# Patient Record
Sex: Female | Born: 1940 | Race: White | Hispanic: No | Marital: Married | Smoking: Former smoker
Health system: Southern US, Community
[De-identification: ages and names within clinical notes are randomized; demographics above are authoritative.]

## PROBLEM LIST (undated history)

## (undated) DIAGNOSIS — M549 Dorsalgia, unspecified: Secondary | ICD-10-CM

## (undated) DIAGNOSIS — Z8679 Personal history of other diseases of the circulatory system: Secondary | ICD-10-CM

## (undated) DIAGNOSIS — M170 Bilateral primary osteoarthritis of knee: Secondary | ICD-10-CM

## (undated) DIAGNOSIS — G5620 Lesion of ulnar nerve, unspecified upper limb: Secondary | ICD-10-CM

## (undated) DIAGNOSIS — E785 Hyperlipidemia, unspecified: Secondary | ICD-10-CM

## (undated) DIAGNOSIS — M8580 Other specified disorders of bone density and structure, unspecified site: Secondary | ICD-10-CM

## (undated) DIAGNOSIS — H269 Unspecified cataract: Secondary | ICD-10-CM

## (undated) DIAGNOSIS — Z8619 Personal history of other infectious and parasitic diseases: Secondary | ICD-10-CM

## (undated) DIAGNOSIS — C4490 Unspecified malignant neoplasm of skin, unspecified: Secondary | ICD-10-CM

## (undated) DIAGNOSIS — K5790 Diverticulosis of intestine, part unspecified, without perforation or abscess without bleeding: Secondary | ICD-10-CM

## (undated) DIAGNOSIS — G8929 Other chronic pain: Secondary | ICD-10-CM

## (undated) HISTORY — PX: SHOULDER DEBRIDEMENT: SHX1052

## (undated) HISTORY — DX: Bilateral primary osteoarthritis of knee: M17.0

## (undated) HISTORY — DX: Lesion of ulnar nerve, unspecified upper limb: G56.20

## (undated) HISTORY — DX: Unspecified cataract: H26.9

## (undated) HISTORY — PX: TUBAL LIGATION: SHX77

## (undated) HISTORY — DX: Hyperlipidemia, unspecified: E78.5

## (undated) HISTORY — PX: APPENDECTOMY: SHX54

## (undated) HISTORY — PX: CHOLECYSTECTOMY: SHX55

## (undated) HISTORY — DX: Diverticulosis of intestine, part unspecified, without perforation or abscess without bleeding: K57.90

## (undated) HISTORY — PX: BACK SURGERY: SHX140

## (undated) HISTORY — PX: LAPAROSCOPIC HYSTERECTOMY: SHX1926

## (undated) HISTORY — PX: ABDOMINAL HYSTERECTOMY: SHX81

---

## 1979-09-17 HISTORY — PX: CARDIAC VALVE REPLACEMENT: SHX585

## 1979-09-17 HISTORY — PX: OTHER SURGICAL HISTORY: SHX169

## 1998-05-24 ENCOUNTER — Other Ambulatory Visit: Admission: RE | Admit: 1998-05-24 | Discharge: 1998-05-24 | Payer: Self-pay | Admitting: Obstetrics and Gynecology

## 1998-08-28 ENCOUNTER — Inpatient Hospital Stay (HOSPITAL_COMMUNITY): Admission: EM | Admit: 1998-08-28 | Discharge: 1998-08-30 | Payer: Self-pay | Admitting: Emergency Medicine

## 1998-08-30 ENCOUNTER — Encounter: Payer: Self-pay | Admitting: *Deleted

## 1999-08-02 ENCOUNTER — Other Ambulatory Visit: Admission: RE | Admit: 1999-08-02 | Discharge: 1999-08-02 | Payer: Self-pay | Admitting: Obstetrics and Gynecology

## 2000-09-10 ENCOUNTER — Other Ambulatory Visit: Admission: RE | Admit: 2000-09-10 | Discharge: 2000-09-10 | Payer: Self-pay | Admitting: Obstetrics and Gynecology

## 2001-10-26 ENCOUNTER — Other Ambulatory Visit: Admission: RE | Admit: 2001-10-26 | Discharge: 2001-10-26 | Payer: Self-pay | Admitting: Obstetrics and Gynecology

## 2002-04-13 HISTORY — PX: COLONOSCOPY: SHX174

## 2003-03-10 ENCOUNTER — Other Ambulatory Visit: Admission: RE | Admit: 2003-03-10 | Discharge: 2003-03-10 | Payer: Self-pay | Admitting: Obstetrics and Gynecology

## 2004-03-21 ENCOUNTER — Other Ambulatory Visit: Admission: RE | Admit: 2004-03-21 | Discharge: 2004-03-21 | Payer: Self-pay | Admitting: Obstetrics and Gynecology

## 2004-10-23 ENCOUNTER — Ambulatory Visit: Payer: Self-pay | Admitting: Cardiology

## 2004-11-05 ENCOUNTER — Ambulatory Visit: Payer: Self-pay | Admitting: *Deleted

## 2004-11-07 ENCOUNTER — Ambulatory Visit: Payer: Self-pay | Admitting: *Deleted

## 2005-01-21 ENCOUNTER — Ambulatory Visit: Payer: Self-pay | Admitting: *Deleted

## 2005-04-09 ENCOUNTER — Ambulatory Visit: Payer: Self-pay | Admitting: Cardiology

## 2005-07-23 ENCOUNTER — Ambulatory Visit: Payer: Self-pay | Admitting: Cardiology

## 2005-10-21 ENCOUNTER — Other Ambulatory Visit: Admission: RE | Admit: 2005-10-21 | Discharge: 2005-10-21 | Payer: Self-pay | Admitting: Obstetrics and Gynecology

## 2005-11-04 ENCOUNTER — Ambulatory Visit: Payer: Self-pay | Admitting: *Deleted

## 2005-11-12 ENCOUNTER — Ambulatory Visit: Payer: Self-pay | Admitting: *Deleted

## 2005-11-25 ENCOUNTER — Encounter: Payer: Self-pay | Admitting: Internal Medicine

## 2005-11-25 ENCOUNTER — Ambulatory Visit: Payer: Self-pay

## 2006-01-08 ENCOUNTER — Ambulatory Visit: Payer: Self-pay | Admitting: *Deleted

## 2006-02-14 ENCOUNTER — Ambulatory Visit: Payer: Self-pay | Admitting: Cardiology

## 2008-04-20 ENCOUNTER — Telehealth (INDEPENDENT_AMBULATORY_CARE_PROVIDER_SITE_OTHER): Payer: Self-pay | Admitting: *Deleted

## 2012-03-16 ENCOUNTER — Encounter: Payer: Self-pay | Admitting: Internal Medicine

## 2012-12-22 ENCOUNTER — Encounter: Payer: Self-pay | Admitting: Internal Medicine

## 2013-02-01 ENCOUNTER — Ambulatory Visit (INDEPENDENT_AMBULATORY_CARE_PROVIDER_SITE_OTHER): Payer: Medicare PPO | Admitting: Family Medicine

## 2013-02-01 ENCOUNTER — Encounter: Payer: Self-pay | Admitting: Family Medicine

## 2013-02-01 ENCOUNTER — Ambulatory Visit (HOSPITAL_BASED_OUTPATIENT_CLINIC_OR_DEPARTMENT_OTHER)
Admission: RE | Admit: 2013-02-01 | Discharge: 2013-02-01 | Disposition: A | Discharge status: Home / Self Care | Payer: Medicare PPO | Source: Ambulatory Visit | Attending: Family Medicine | Admitting: Family Medicine

## 2013-02-01 VITALS — BP 149/84 | HR 82 | Ht 66.0 in | Wt 145.0 lb

## 2013-02-01 DIAGNOSIS — M546 Pain in thoracic spine: Secondary | ICD-10-CM

## 2013-02-01 DIAGNOSIS — R937 Abnormal findings on diagnostic imaging of other parts of musculoskeletal system: Secondary | ICD-10-CM | POA: Insufficient documentation

## 2013-02-01 DIAGNOSIS — S22000A Wedge compression fracture of unspecified thoracic vertebra, initial encounter for closed fracture: Secondary | ICD-10-CM

## 2013-02-01 DIAGNOSIS — S22009A Unspecified fracture of unspecified thoracic vertebra, initial encounter for closed fracture: Secondary | ICD-10-CM

## 2013-02-01 MED ORDER — TRAMADOL HCL 50 MG PO TABS: 50.0000 mg | ORAL_TABLET | Freq: Four times a day (QID) | ORAL | Status: DC | PRN

## 2013-02-01 MED ORDER — LIDOCAINE 5 % EX PTCH: 1.0000 | MEDICATED_PATCH | CUTANEOUS | Status: DC

## 2013-02-01 MED ORDER — TIZANIDINE HCL 4 MG PO TABS: 4.0000 mg | ORAL_TABLET | Freq: Three times a day (TID) | ORAL | Status: DC

## 2013-02-01 NOTE — Progress Notes (Signed)
  Subjective:    Patient ID: Marissa Brown, female    DOB: Jun 14, 1941, 72 y.o.   MRN: [id number]  Marissa Brown is here today complaining of pain in the middle of her upper back.  She fell on her back about 9 weeks ago.  She went to see a chiropractor (Dr. Cyril Mourning) and was told she has a compression fracture in her thoracic spine.  She feels that this problem has not improved with the chiropractor so she decided to get a second opinion.   Back Pain This is a new problem. The current episode started more than 1 month ago. The problem occurs daily. The problem has been gradually improving since onset. The pain is present in the thoracic spine. The quality of the pain is described as aching and cramping. The pain does not radiate. The pain is at a severity of 5/10. The pain is moderate. The pain is worse during the day. The symptoms are aggravated by position, sitting and standing. Stiffness is present all day. Pertinent negatives include no numbness, paresthesias or weakness. Risk factors include recent trauma. She has tried chiropractic manipulation, NSAIDs, home exercises and heat for the symptoms. The treatment provided mild relief.   Review of Systems  Musculoskeletal: Positive for back pain.  Neurological: Negative for weakness, numbness and paresthesias.   Past Medical History  Diagnosis Date  . Hyperlipidemia   . Hypertension   . Osteoarthritis of both knees     and hands  . Cubital tunnel syndrome   . Diverticulosis   . Cataract     Right Eye   Family History  Problem Relation Age of Onset  . Heart disease Father   . Heart disease Paternal Uncle    History   Social History Narrative   Marital Status: Married Radiation protection practitioner)   Children:  Son (1) Daughter (1)     Pets: None   Living Situation: Lives with husband   Occupation:  Retired Human resources officer)   Education: Engineer, maintenance (IT)    Tobacco Use/Exposure:  Quit 15 yrs ago   Alcohol Use:  Occasional   Drug Use:  None   Diet:  Regular   Exercise:  None   Hobbies: Yard Work/ Manufacturing engineer             Objective:   Physical Exam  Constitutional: No distress.  Neck: Neck supple.  Cardiovascular: Normal rate, regular rhythm and normal heart sounds.   Pulmonary/Chest: Effort normal and breath sounds normal. No respiratory distress. She exhibits no tenderness.  Musculoskeletal: She exhibits tenderness (Pain in mid thoracic spine ).          Assessment & Plan:

## 2013-02-13 DIAGNOSIS — S22000A Wedge compression fracture of unspecified thoracic vertebra, initial encounter for closed fracture: Secondary | ICD-10-CM | POA: Insufficient documentation

## 2013-02-13 DIAGNOSIS — M546 Pain in thoracic spine: Secondary | ICD-10-CM | POA: Insufficient documentation

## 2013-02-13 NOTE — Assessment & Plan Note (Addendum)
Marissa Brown is to get a TLSO brace that she will wear until she can see Dr. Gerlene Fee.  She may benefit from kyphoplasty. She was given medications to help her pain.

## 2013-02-17 ENCOUNTER — Other Ambulatory Visit: Payer: Self-pay | Admitting: Neurosurgery

## 2013-02-17 DIAGNOSIS — IMO0002 Reserved for concepts with insufficient information to code with codable children: Secondary | ICD-10-CM

## 2013-02-20 ENCOUNTER — Ambulatory Visit
Admission: RE | Admit: 2013-02-20 | Discharge: 2013-02-20 | Disposition: A | Discharge status: Home / Self Care | Payer: Medicare PPO | Source: Ambulatory Visit | Attending: Neurosurgery | Admitting: Neurosurgery

## 2013-02-20 DIAGNOSIS — IMO0002 Reserved for concepts with insufficient information to code with codable children: Secondary | ICD-10-CM

## 2013-03-11 ENCOUNTER — Encounter (HOSPITAL_COMMUNITY): Payer: Self-pay | Admitting: Pharmacy Technician

## 2013-03-15 ENCOUNTER — Inpatient Hospital Stay (HOSPITAL_COMMUNITY)
Admission: RE | Admit: 2013-03-15 | Discharge: 2013-03-19 | DRG: 517 | Disposition: A | Discharge status: Home / Self Care | Payer: Medicare PPO | Source: Ambulatory Visit | Attending: Neurosurgery | Admitting: Neurosurgery

## 2013-03-15 ENCOUNTER — Encounter (HOSPITAL_COMMUNITY): Payer: Self-pay

## 2013-03-15 DIAGNOSIS — X58XXXA Exposure to other specified factors, initial encounter: Secondary | ICD-10-CM | POA: Diagnosis present

## 2013-03-15 DIAGNOSIS — M19049 Primary osteoarthritis, unspecified hand: Secondary | ICD-10-CM | POA: Diagnosis present

## 2013-03-15 DIAGNOSIS — Z7901 Long term (current) use of anticoagulants: Secondary | ICD-10-CM

## 2013-03-15 DIAGNOSIS — Z9851 Tubal ligation status: Secondary | ICD-10-CM

## 2013-03-15 DIAGNOSIS — Z79899 Other long term (current) drug therapy: Secondary | ICD-10-CM

## 2013-03-15 DIAGNOSIS — K5730 Diverticulosis of large intestine without perforation or abscess without bleeding: Secondary | ICD-10-CM | POA: Diagnosis present

## 2013-03-15 DIAGNOSIS — Z87891 Personal history of nicotine dependence: Secondary | ICD-10-CM

## 2013-03-15 DIAGNOSIS — E785 Hyperlipidemia, unspecified: Secondary | ICD-10-CM | POA: Diagnosis present

## 2013-03-15 DIAGNOSIS — Z9089 Acquired absence of other organs: Secondary | ICD-10-CM

## 2013-03-15 DIAGNOSIS — IMO0002 Reserved for concepts with insufficient information to code with codable children: Principal | ICD-10-CM | POA: Diagnosis present

## 2013-03-15 DIAGNOSIS — Z8249 Family history of ischemic heart disease and other diseases of the circulatory system: Secondary | ICD-10-CM

## 2013-03-15 DIAGNOSIS — Z954 Presence of other heart-valve replacement: Secondary | ICD-10-CM

## 2013-03-15 DIAGNOSIS — I10 Essential (primary) hypertension: Secondary | ICD-10-CM | POA: Diagnosis present

## 2013-03-15 DIAGNOSIS — M1710 Unilateral primary osteoarthritis, unspecified knee: Secondary | ICD-10-CM | POA: Diagnosis present

## 2013-03-15 MED ORDER — SIMVASTATIN 40 MG PO TABS
40.0000 mg | ORAL_TABLET | Freq: Every evening | ORAL | Status: DC
  Administered 2013-03-15 – 2013-03-18 (×4): 40 mg via ORAL
  Filled 2013-03-15 (×5): qty 1

## 2013-03-15 MED ORDER — HEPARIN SODIUM (PORCINE) 5000 UNIT/ML IJ SOLN
5000.0000 [IU] | Freq: Three times a day (TID) | INTRAMUSCULAR | Status: DC
  Administered 2013-03-15 – 2013-03-16 (×3): 5000 [IU] via SUBCUTANEOUS
  Filled 2013-03-15 (×6): qty 1

## 2013-03-15 MED ORDER — HYDROCODONE-ACETAMINOPHEN 5-325 MG PO TABS: 1.0000 | ORAL_TABLET | ORAL | Status: DC | PRN

## 2013-03-15 MED ORDER — DIPHENHYDRAMINE HCL 25 MG PO CAPS
25.0000 mg | ORAL_CAPSULE | Freq: Four times a day (QID) | ORAL | Status: DC | PRN
  Administered 2013-03-15 – 2013-03-17 (×3): 25 mg via ORAL
  Administered 2013-03-18: 50 mg via ORAL
  Filled 2013-03-15 (×3): qty 1
  Filled 2013-03-15: qty 2

## 2013-03-15 MED ORDER — HYDROCHLOROTHIAZIDE 12.5 MG PO CAPS
12.5000 mg | ORAL_CAPSULE | Freq: Every day | ORAL | Status: DC
  Administered 2013-03-15 – 2013-03-18 (×4): 12.5 mg via ORAL
  Filled 2013-03-15 (×5): qty 1

## 2013-03-15 MED ORDER — PNEUMOCOCCAL VAC POLYVALENT 25 MCG/0.5ML IJ INJ
0.5000 mL | INJECTION | INTRAMUSCULAR | Status: AC
  Administered 2013-03-16: 0.5 mL via INTRAMUSCULAR
  Filled 2013-03-15: qty 0.5

## 2013-03-15 MED ORDER — CELECOXIB 200 MG PO CAPS
200.0000 mg | ORAL_CAPSULE | Freq: Every day | ORAL | Status: DC
  Administered 2013-03-15 – 2013-03-18 (×4): 200 mg via ORAL
  Filled 2013-03-15 (×6): qty 1

## 2013-03-15 MED ORDER — LISINOPRIL 10 MG PO TABS
10.0000 mg | ORAL_TABLET | Freq: Every day | ORAL | Status: DC
  Administered 2013-03-15 – 2013-03-18 (×4): 10 mg via ORAL
  Filled 2013-03-15 (×5): qty 1

## 2013-03-15 MED ORDER — LISINOPRIL-HYDROCHLOROTHIAZIDE 20-25 MG PO TABS: 0.5000 | ORAL_TABLET | Freq: Every day | ORAL | Status: DC

## 2013-03-15 NOTE — Plan of Care (Signed)
Problem: Phase I Progression Outcomes Goal: OOB as tolerated unless otherwise ordered Outcome: Progressing Patient instructed to call for assistance when up but prefers to get up to the bathroom per self.

## 2013-03-15 NOTE — Progress Notes (Signed)
Pharmacy note: heparin Indication: VTE prophylaxis  72 yo female to begin heparin for VTE prophylaxis. Pharmacy has been asked to provide dosing.  Plan -Will give heparin 5000 units sq q8h -Will sign off for now. Please cotnact pharmacy for any questions or concerns  Thank you, Harland German, Pharm D 03/15/2013 11:56 AM

## 2013-03-15 NOTE — Plan of Care (Signed)
Problem: Phase I Progression Outcomes Goal: OOB as tolerated unless otherwise ordered Outcome: Progressing Patient intent to return home upon completion of surgery. Goal: Voiding-avoid urinary catheter unless indicated Outcome: Completed/Met Date Met:  03/15/13 Patient able to fully void without difficulty.

## 2013-03-16 ENCOUNTER — Other Ambulatory Visit: Payer: Self-pay | Admitting: Neurosurgery

## 2013-03-16 LAB — CBC
MCV: 95.6 fL (ref 78.0–100.0)
Platelets: 177 10*3/uL (ref 150–400)
RBC: 3.87 MIL/uL (ref 3.87–5.11)
RDW: 12.7 % (ref 11.5–15.5)
WBC: 4.9 10*3/uL (ref 4.0–10.5)

## 2013-03-16 LAB — PROTIME-INR: Prothrombin Time: 16.5 seconds — ABNORMAL HIGH (ref 11.6–15.2)

## 2013-03-16 LAB — HEPARIN LEVEL (UNFRACTIONATED): Heparin Unfractionated: 0.66 IU/mL (ref 0.30–0.70)

## 2013-03-16 MED ORDER — HEPARIN (PORCINE) IN NACL 100-0.45 UNIT/ML-% IJ SOLN
800.0000 [IU]/h | INTRAMUSCULAR | Status: AC
  Administered 2013-03-16 – 2013-03-17 (×2): 800 [IU]/h via INTRAVENOUS
  Filled 2013-03-16 (×3): qty 250

## 2013-03-16 MED ORDER — HEPARIN BOLUS VIA INFUSION
3000.0000 [IU] | Freq: Once | INTRAVENOUS | Status: AC
  Administered 2013-03-16: 3000 [IU] via INTRAVENOUS
  Filled 2013-03-16: qty 3000

## 2013-03-16 NOTE — H&P (Signed)
  Marissa Brown is an 72 y.o. female.   Chief Complaint: Compression fractures HPI: 72 yo female with severe back pain. Tried on conservative therapy, and no help. Found on imaging to have T7 and T 8 fractures. Now admitted for vertebroplasty. Has artificial heart valve, and has to be anti coagulated. Was on coumadin. Now on heparin until surgery.  Past Medical History  Diagnosis Date  . Hyperlipidemia   . Hypertension   . Osteoarthritis of both knees     and hands  . Cubital tunnel syndrome   . Diverticulosis   . Cataract     Right Eye    Past Surgical History  Procedure Laterality Date  . Cholecystectomy    . Appendectomy    . Tubal ligation    . Shoulder debridement Left     Due to Calcium Deposit  . Cardiac valve replacement  1981    Aortic Valve   . Aortic heart valve  1981    patient must have antibiotics prior to surgical procedures    Family History  Problem Relation Age of Onset  . Heart disease Father   . Heart disease Paternal Uncle    Social History:  reports that she has quit smoking. Her smoking use included Cigarettes. She smoked 0.00 packs per day. She has never used smokeless tobacco. She reports that  drinks alcohol. She reports that she does not use illicit drugs.  Allergies: No Known Allergies  Medications Prior to Admission  Medication Sig Dispense Refill  . calcium citrate-vitamin D (CITRACAL+D) 315-200 MG-UNIT per tablet Take 1 tablet by mouth daily.      . celecoxib (CELEBREX) 200 MG capsule Take 200 mg by mouth daily.       . diphenhydramine-acetaminophen (TYLENOL PM) 25-500 MG TABS Take 1 tablet by mouth at bedtime.      Marland Kitchen lisinopril-hydrochlorothiazide (PRINZIDE,ZESTORETIC) 20-25 MG per tablet Take 0.5 tablets by mouth daily.      . simvastatin (ZOCOR) 40 MG tablet Take 40 mg by mouth every evening.      . warfarin (COUMADIN) 5 MG tablet Take 5-7.5 mg by mouth daily. Take 1 tablet on Tuesday and Thursday.  Take 1.5 tablet (7.5mg ) all other days         No results found for this or any previous visit (from the past 48 hour(s)). No results found.  A comprehensive review of systems was negative.  Blood pressure 110/52, pulse 58, temperature 98 F (36.7 C), temperature source Oral, resp. rate 20, height 5\' 6"  (1.676 m), weight 64.6 kg (142 lb 6.7 oz), SpO2 98.00%.  awake, alert. conversant. Strength intact. gait slow but steady Assessment/Plan Compression fractures x 2. Plan for 2 level vertebroplasty in 2 days.  Reinaldo Meeker, MD 03/16/2013, 1:18 PM

## 2013-03-16 NOTE — Progress Notes (Signed)
ANTICOAGULATION CONSULT NOTE  Pharmacy Consult for heparin Indication: AVR  No Known Allergies  Patient Measurements: Height: 5\' 6"  (167.6 cm) Weight: 142 lb 6.7 oz (64.6 kg) IBW/kg (Calculated) : 59.3 Heparin Dosing Weight: 64.6kg  Vital Signs: Temp: 98.7 F (37.1 C) (07/01 2115) Temp src: Oral (07/01 2115) BP: 123/59 mmHg (07/01 2115) Pulse Rate: 63 (07/01 2115)  Labs:  Recent Labs  03/16/13 1430 03/16/13 2248  HGB 12.9  --   HCT 37.0  --   PLT 177  --   LABPROT 16.5*  --   INR 1.37  --   HEPARINUNFRC  --  0.66    CrCl is unknown because no creatinine reading has been taken.  Assessment: 72 yo female with history of AVR, Coumadin on hold for vertebroplasty for heparin  Goal of Therapy:  Heparin level 0.3-0.7 units/ml Monitor platelets by anticoagulation protocol: Yes   Plan:  Continue Heparin at current rate Follow-up am labs.  Geannie Risen, PharmD, BCPS  03/16/2013 11:24 PM

## 2013-03-16 NOTE — Progress Notes (Signed)
ANTICOAGULATION CONSULT NOTE - Initial Consult  Pharmacy Consult for heparin Indication: AVR  No Known Allergies  Patient Measurements: Height: 5\' 6"  (167.6 cm) Weight: 142 lb 6.7 oz (64.6 kg) IBW/kg (Calculated) : 59.3 Heparin Dosing Weight: 64.6kg  Vital Signs: Temp: 98 F (36.7 C) (07/01 1350) Temp src: Oral (07/01 1350) BP: 113/56 mmHg (07/01 1350) Pulse Rate: 63 (07/01 1350)  Labs:  Recent Labs  03/16/13 1430  HGB 12.9  HCT 37.0  PLT 177  LABPROT 16.5*  INR 1.37    CrCl is unknown because no creatinine reading has been taken.   Medical History: Past Medical History  Diagnosis Date  . Hyperlipidemia   . Hypertension   . Osteoarthritis of both knees     and hands  . Cubital tunnel syndrome   . Diverticulosis   . Cataract     Right Eye    Medications:  Prescriptions prior to admission  Medication Sig Dispense Refill  . calcium citrate-vitamin D (CITRACAL+D) 315-200 MG-UNIT per tablet Take 1 tablet by mouth daily.      . celecoxib (CELEBREX) 200 MG capsule Take 200 mg by mouth daily.       . diphenhydramine-acetaminophen (TYLENOL PM) 25-500 MG TABS Take 1 tablet by mouth at bedtime.      Marland Kitchen lisinopril-hydrochlorothiazide (PRINZIDE,ZESTORETIC) 20-25 MG per tablet Take 0.5 tablets by mouth daily.      . simvastatin (ZOCOR) 40 MG tablet Take 40 mg by mouth every evening.      . warfarin (COUMADIN) 5 MG tablet Take 5-7.5 mg by mouth daily. Take 1 tablet on Tuesday and Thursday.  Take 1.5 tablet (7.5mg ) all other days       Scheduled:  . celecoxib  200 mg Oral Daily  . lisinopril  10 mg Oral Daily   And  . hydrochlorothiazide  12.5 mg Oral Daily  . simvastatin  40 mg Oral QPM    Assessment: 72 yo female with history of AVR here for vertebroplasty. INR= 1.37 and patient to begin heparin as coumadin on hold for surgery. Patient was on sq heparin and last dose was this am at ~ 0600. Hg/Hct= 12.9/37 and plt=177.  Goal of Therapy:  Heparin level 0.3-0.7  units/ml Monitor platelets by anticoagulation protocol: Yes   Plan:  -Heparin 3000 units IV bolus followed by 800 units/hr (~12 units/kg/hr) -Heparin level in 6 hours and daily wth CBC daily  Harland German, Pharm D 03/16/2013 3:22 PM

## 2013-03-16 NOTE — Progress Notes (Signed)
UR COMPLETED  

## 2013-03-17 LAB — CBC
Hemoglobin: 12.9 g/dL (ref 12.0–15.0)
MCH: 33.2 pg (ref 26.0–34.0)
MCHC: 34.6 g/dL (ref 30.0–36.0)
RDW: 12.8 % (ref 11.5–15.5)

## 2013-03-17 LAB — HEPARIN LEVEL (UNFRACTIONATED): Heparin Unfractionated: 0.61 IU/mL (ref 0.30–0.70)

## 2013-03-17 NOTE — Progress Notes (Signed)
Patient ID: Marissa Brown, female   DOB: Jul 21, 1941, 72 y.o.   MRN: [id number] Afeb. vss Ready for surgery tomorrow. Risks benefits discussed. Questions answered. Appears to understand. Will proceed as requested.

## 2013-03-17 NOTE — Progress Notes (Signed)
ANTICOAGULATION CONSULT NOTE  Pharmacy Consult for heparin Indication: AVR  No Known Allergies  Patient Measurements: Height: 5\' 6"  (167.6 cm) Weight: 142 lb 6.7 oz (64.6 kg) IBW/kg (Calculated) : 59.3 Heparin Dosing Weight: 64.6kg  Vital Signs: Temp: 97.4 F (36.3 C) (07/02 0521) Temp src: Oral (07/02 0521) BP: 125/59 mmHg (07/02 0521) Pulse Rate: 58 (07/02 0521)  Labs:  Recent Labs  03/16/13 1430 03/16/13 2248 03/17/13 0525  HGB 12.9  --  12.9  HCT 37.0  --  37.3  PLT 177  --  174  LABPROT 16.5*  --   --   INR 1.37  --   --   HEPARINUNFRC  --  0.66 0.61    CrCl is unknown because no creatinine reading has been taken.  Assessment: 72 yo female with history of AVR, Coumadin on hold for vertebroplasty for heparin  Heparin level therapeutic  Goal of Therapy:  Heparin level 0.3-0.7 units/ml Monitor platelets by anticoagulation protocol: Yes   Plan:  Continue Heparin at current rate Follow-up am labs.  Talbert Cage, PharmD  03/17/2013 1:04 PM

## 2013-03-18 ENCOUNTER — Inpatient Hospital Stay (HOSPITAL_COMMUNITY): Payer: Medicare PPO

## 2013-03-18 ENCOUNTER — Encounter (HOSPITAL_COMMUNITY): Payer: Self-pay | Admitting: Certified Registered Nurse Anesthetist

## 2013-03-18 ENCOUNTER — Encounter (HOSPITAL_COMMUNITY)
Admission: RE | Disposition: A | Discharge status: Home / Self Care | Payer: Self-pay | Source: Ambulatory Visit | Attending: Neurosurgery

## 2013-03-18 ENCOUNTER — Inpatient Hospital Stay (HOSPITAL_COMMUNITY): Payer: Medicare PPO | Admitting: Certified Registered Nurse Anesthetist

## 2013-03-18 HISTORY — PX: VERTEBROPLASTY: SHX113

## 2013-03-18 LAB — CBC
MCH: 32.7 pg (ref 26.0–34.0)
MCV: 96.2 fL (ref 78.0–100.0)
Platelets: 175 10*3/uL (ref 150–400)
RDW: 12.7 % (ref 11.5–15.5)
WBC: 6.4 10*3/uL (ref 4.0–10.5)

## 2013-03-18 SURGERY — VERTEBROPLASTY
Anesthesia: General | Site: Spine Thoracic | Wound class: Clean

## 2013-03-18 MED ORDER — PHENOL 1.4 % MT LIQD: 1.0000 | OROMUCOSAL | Status: DC | PRN

## 2013-03-18 MED ORDER — MEPERIDINE HCL 25 MG/ML IJ SOLN: 6.2500 mg | INTRAMUSCULAR | Status: DC | PRN

## 2013-03-18 MED ORDER — ONDANSETRON HCL 4 MG/2ML IJ SOLN
INTRAMUSCULAR | Status: DC | PRN
  Administered 2013-03-18: 4 mg via INTRAVENOUS

## 2013-03-18 MED ORDER — GLYCOPYRROLATE 0.2 MG/ML IJ SOLN
INTRAMUSCULAR | Status: DC | PRN
  Administered 2013-03-18: 0.4 mg via INTRAVENOUS

## 2013-03-18 MED ORDER — ACETAMINOPHEN 325 MG PO TABS: 650.0000 mg | ORAL_TABLET | ORAL | Status: DC | PRN

## 2013-03-18 MED ORDER — PROMETHAZINE HCL 25 MG/ML IJ SOLN
6.2500 mg | INTRAMUSCULAR | Status: DC | PRN
  Filled 2013-03-18: qty 1

## 2013-03-18 MED ORDER — LACTATED RINGERS IV SOLN
INTRAVENOUS | Status: DC | PRN
  Administered 2013-03-18 (×2): via INTRAVENOUS

## 2013-03-18 MED ORDER — WARFARIN SODIUM 5 MG PO TABS: 5.0000 mg | ORAL_TABLET | Freq: Every day | ORAL | Status: DC

## 2013-03-18 MED ORDER — NEOSTIGMINE METHYLSULFATE 1 MG/ML IJ SOLN
INTRAMUSCULAR | Status: DC | PRN
  Administered 2013-03-18: 3 mg via INTRAVENOUS

## 2013-03-18 MED ORDER — MENTHOL 3 MG MT LOZG
1.0000 | LOZENGE | OROMUCOSAL | Status: DC | PRN
  Filled 2013-03-18: qty 9

## 2013-03-18 MED ORDER — CEFAZOLIN SODIUM 1-5 GM-% IV SOLN
1.0000 g | Freq: Three times a day (TID) | INTRAVENOUS | Status: AC
  Administered 2013-03-18 – 2013-03-19 (×2): 1 g via INTRAVENOUS
  Filled 2013-03-18 (×2): qty 50

## 2013-03-18 MED ORDER — BUPIVACAINE HCL 0.5 % IJ SOLN
INTRAMUSCULAR | Status: DC | PRN
  Administered 2013-03-18: 9 mL
  Administered 2013-03-18: 7 mL

## 2013-03-18 MED ORDER — 0.9 % SODIUM CHLORIDE (POUR BTL) OPTIME
TOPICAL | Status: DC | PRN
  Administered 2013-03-18: 1000 mL

## 2013-03-18 MED ORDER — HYDROMORPHONE HCL PF 1 MG/ML IJ SOLN: 0.2500 mg | INTRAMUSCULAR | Status: DC | PRN

## 2013-03-18 MED ORDER — KCL IN DEXTROSE-NACL 20-5-0.45 MEQ/L-%-% IV SOLN
80.0000 mL/h | INTRAVENOUS | Status: DC
  Administered 2013-03-18: 80 mL/h via INTRAVENOUS
  Filled 2013-03-18 (×3): qty 1000

## 2013-03-18 MED ORDER — MIDAZOLAM HCL 2 MG/2ML IJ SOLN: 0.5000 mg | Freq: Once | INTRAMUSCULAR | Status: DC | PRN

## 2013-03-18 MED ORDER — WARFARIN SODIUM 7.5 MG PO TABS
7.5000 mg | ORAL_TABLET | ORAL | Status: DC
  Filled 2013-03-18: qty 1

## 2013-03-18 MED ORDER — SODIUM CHLORIDE 0.9 % IJ SOLN: 3.0000 mL | INTRAMUSCULAR | Status: DC | PRN

## 2013-03-18 MED ORDER — CEFAZOLIN SODIUM-DEXTROSE 2-3 GM-% IV SOLR
INTRAVENOUS | Status: DC | PRN
  Administered 2013-03-18: 2 g via INTRAVENOUS

## 2013-03-18 MED ORDER — EPHEDRINE SULFATE 50 MG/ML IJ SOLN
INTRAMUSCULAR | Status: DC | PRN
  Administered 2013-03-18 (×3): 5 mg via INTRAVENOUS

## 2013-03-18 MED ORDER — HYDROMORPHONE HCL PF 1 MG/ML IJ SOLN: 1.0000 mg | INTRAMUSCULAR | Status: DC | PRN

## 2013-03-18 MED ORDER — LIDOCAINE HCL (CARDIAC) 20 MG/ML IV SOLN
INTRAVENOUS | Status: DC | PRN
  Administered 2013-03-18: 30 mg via INTRAVENOUS

## 2013-03-18 MED ORDER — OXYCODONE HCL 5 MG PO TABS: 5.0000 mg | ORAL_TABLET | Freq: Once | ORAL | Status: DC | PRN

## 2013-03-18 MED ORDER — SODIUM CHLORIDE 0.9 % IJ SOLN
3.0000 mL | Freq: Two times a day (BID) | INTRAMUSCULAR | Status: DC
  Administered 2013-03-18: 3 mL via INTRAVENOUS

## 2013-03-18 MED ORDER — SODIUM CHLORIDE 0.9 % IV SOLN: 250.0000 mL | INTRAVENOUS | Status: DC

## 2013-03-18 MED ORDER — HYDROCODONE-ACETAMINOPHEN 5-325 MG PO TABS: 1.0000 | ORAL_TABLET | ORAL | Status: DC | PRN

## 2013-03-18 MED ORDER — HEPARIN (PORCINE) IN NACL 100-0.45 UNIT/ML-% IJ SOLN
800.0000 [IU]/h | INTRAMUSCULAR | Status: DC
  Administered 2013-03-18: 800 [IU]/h via INTRAVENOUS
  Filled 2013-03-18 (×2): qty 250

## 2013-03-18 MED ORDER — ONDANSETRON HCL 4 MG/2ML IJ SOLN: 4.0000 mg | INTRAMUSCULAR | Status: DC | PRN

## 2013-03-18 MED ORDER — ACETAMINOPHEN 650 MG RE SUPP: 650.0000 mg | RECTAL | Status: DC | PRN

## 2013-03-18 MED ORDER — PROPOFOL 10 MG/ML IV BOLUS
INTRAVENOUS | Status: DC | PRN
  Administered 2013-03-18: 80 mg via INTRAVENOUS

## 2013-03-18 MED ORDER — ROCURONIUM BROMIDE 100 MG/10ML IV SOLN
INTRAVENOUS | Status: DC | PRN
  Administered 2013-03-18: 40 mg via INTRAVENOUS

## 2013-03-18 MED ORDER — DEXAMETHASONE SODIUM PHOSPHATE 4 MG/ML IJ SOLN
INTRAMUSCULAR | Status: DC | PRN
  Administered 2013-03-18: 4 mg via INTRAVENOUS

## 2013-03-18 MED ORDER — WARFARIN SODIUM 5 MG PO TABS
5.0000 mg | ORAL_TABLET | ORAL | Status: DC
  Administered 2013-03-18: 5 mg via ORAL
  Filled 2013-03-18: qty 1

## 2013-03-18 MED ORDER — WARFARIN - PHYSICIAN DOSING INPATIENT: Freq: Every day | Status: DC

## 2013-03-18 MED ORDER — MIDAZOLAM HCL 5 MG/5ML IJ SOLN
INTRAMUSCULAR | Status: DC | PRN
  Administered 2013-03-18: 2 mg via INTRAVENOUS
  Administered 2013-03-18: 100 mg via INTRAVENOUS

## 2013-03-18 MED ORDER — OXYCODONE HCL 5 MG/5ML PO SOLN: 5.0000 mg | Freq: Once | ORAL | Status: DC | PRN

## 2013-03-18 SURGICAL SUPPLY — 35 items
BANDAGE ADHESIVE 1X3 (GAUZE/BANDAGES/DRESSINGS) ×3 IMPLANT
BLADE SURG 15 STRL LF DISP TIS (BLADE) ×1 IMPLANT
BLADE SURG 15 STRL SS (BLADE) ×2
BLADE SURG ROTATE 9660 (MISCELLANEOUS) IMPLANT
Bone Cement  and Saturate Mixing System ×2 IMPLANT
CLOTH BEACON ORANGE TIMEOUT ST (SAFETY) ×2 IMPLANT
CONT SPEC 4OZ CLIKSEAL STRL BL (MISCELLANEOUS) ×2 IMPLANT
DRAPE C-ARM 42X72 X-RAY (DRAPES) ×4 IMPLANT
DRAPE INCISE IOBAN 66X45 STRL (DRAPES) ×2 IMPLANT
DRAPE LAPAROTOMY 100X72X124 (DRAPES) ×2 IMPLANT
DURAPREP 26ML APPLICATOR (WOUND CARE) ×2 IMPLANT
GAUZE SPONGE 4X4 16PLY XRAY LF (GAUZE/BANDAGES/DRESSINGS) ×2 IMPLANT
GLOVE BIOGEL PI IND STRL 7.5 (GLOVE) IMPLANT
GLOVE BIOGEL PI INDICATOR 7.5 (GLOVE) ×2
GLOVE ECLIPSE 7.5 STRL STRAW (GLOVE) ×2 IMPLANT
GLOVE EXAM NITRILE LRG STRL (GLOVE) IMPLANT
GLOVE EXAM NITRILE XL STR (GLOVE) IMPLANT
GLOVE EXAM NITRILE XS STR PU (GLOVE) IMPLANT
GLOVE SURG SS PI 7.0 STRL IVOR (GLOVE) ×2 IMPLANT
GOWN BRE IMP SLV AUR LG STRL (GOWN DISPOSABLE) ×1 IMPLANT
GOWN BRE IMP SLV AUR XL STRL (GOWN DISPOSABLE) ×4 IMPLANT
GOWN STRL REIN 2XL LVL4 (GOWN DISPOSABLE) ×1 IMPLANT
KIT AUGMENTATION STABILIT VERT ×2 IMPLANT
KIT BASIN OR (CUSTOM PROCEDURE TRAY) ×2 IMPLANT
KIT ROOM TURNOVER OR (KITS) ×2 IMPLANT
MARKER SKIN DUAL TIP RULER LAB (MISCELLANEOUS) ×2 IMPLANT
NEEDLE HYPO 22GX1.5 SAFETY (NEEDLE) ×2 IMPLANT
PACK EENT II TURBAN DRAPE (CUSTOM PROCEDURE TRAY) ×2 IMPLANT
PAD ARMBOARD 7.5X6 YLW CONV (MISCELLANEOUS) ×6 IMPLANT
STABILI T FIRST FRACTURE KIT ×1 IMPLANT
STAPLER SKIN PROX WIDE 3.9 (STAPLE) ×2 IMPLANT
SYR CONTROL 10ML LL (SYRINGE) ×2 IMPLANT
SYSTEM BONE CEMENT MIXING (KITS) ×2 IMPLANT
TOWEL OR 17X24 6PK STRL BLUE (TOWEL DISPOSABLE) ×2 IMPLANT
TOWEL OR 17X26 10 PK STRL BLUE (TOWEL DISPOSABLE) ×2 IMPLANT

## 2013-03-18 NOTE — Anesthesia Procedure Notes (Signed)
Procedure Name: Intubation Date/Time: 03/18/2013 10:41 AM Performed by: Margaree Mackintosh Pre-anesthesia Checklist: Patient identified, Timeout performed, Emergency Drugs available, Suction available and Patient being monitored Patient Re-evaluated:Patient Re-evaluated prior to inductionOxygen Delivery Method: Circle system utilized Intubation Type: IV induction Ventilation: Mask ventilation without difficulty Laryngoscope Size: Mac and 3 Grade View: Grade III Tube type: Oral Tube size: 7.0 mm Number of attempts: 1 Airway Equipment and Method: Stylet and LTA kit utilized Placement Confirmation: ETT inserted through vocal cords under direct vision,  positive ETCO2 and breath sounds checked- equal and bilateral Secured at: 22 cm Tube secured with: Tape Dental Injury: Teeth and Oropharynx as per pre-operative assessment

## 2013-03-18 NOTE — Preoperative (Signed)
Beta Blockers   Reason not to administer Beta Blockers:Not Applicable 

## 2013-03-18 NOTE — Anesthesia Preprocedure Evaluation (Addendum)
Anesthesia Evaluation  Patient identified by MRN, date of birth, ID band Patient awake    Reviewed: Allergy & Precautions, H&P , NPO status , Patient's Chart, lab work & pertinent test results  History of Anesthesia Complications Negative for: history of anesthetic complications  Airway Mallampati: II TM Distance: >3 FB Neck ROM: Full    Dental  (+) Teeth Intact and Dental Advisory Given   Pulmonary neg pulmonary ROS, former smoker,  breath sounds clear to auscultation        Cardiovascular hypertension, Pt. on medications + Valvular Problems/Murmurs (s/p AVR 1981) Rhythm:Regular Rate:Normal  S/P AVR 1981, long term coumadin regimen 7/1 INR 1.37 '07 ECHO: normal LVF, AVR working well with trivial AI   Neuro/Psych Resolved Cubital Tunnel Syndrome 2006  Neuromuscular disease negative psych ROS   GI/Hepatic negative GI ROS, Neg liver ROS,   Endo/Other  negative endocrine ROS  Renal/GU negative Renal ROS     Musculoskeletal   Abdominal Normal abdominal exam  (+)   Peds  Hematology Coumadin regimen, INR 1.37   Anesthesia Other Findings   Reproductive/Obstetrics                       Anesthesia Physical Anesthesia Plan  ASA: III  Anesthesia Plan: General   Post-op Pain Management:    Induction: Intravenous  Airway Management Planned: Oral ETT  Additional Equipment:   Intra-op Plan:   Post-operative Plan: Extubation in OR  Informed Consent: I have reviewed the patients History and Physical, chart, labs and discussed the procedure including the risks, benefits and alternatives for the proposed anesthesia with the patient or authorized representative who has indicated his/her understanding and acceptance.   Dental advisory given  Plan Discussed with: CRNA and Surgeon  Anesthesia Plan Comments: (Plan routine monitors, GETA)        Anesthesia Quick Evaluation

## 2013-03-18 NOTE — Progress Notes (Signed)
Pt resting at intervals in bed, up about in room, voided several times this afternoon. Denies pain.  Gloriajean Okun [employer]

## 2013-03-18 NOTE — Progress Notes (Signed)
ANTICOAGULATION CONSULT NOTE - Follow Up Consult  Pharmacy Consult for Heparin Indication: AVR  No Known Allergies  Patient Measurements: Height: 5\' 6"  (167.6 cm) Weight: 142 lb 6.7 oz (64.6 kg) IBW/kg (Calculated) : 59.3 Heparin Dosing Weight: 64.6 kg  Vital Signs: Temp: 97.3 F (36.3 C) (07/03 1400) Temp src: Oral (07/03 1400) BP: 135/50 mmHg (07/03 1400) Pulse Rate: 64 (07/03 1400)  Labs:  Recent Labs  03/16/13 1430 03/16/13 2248 03/17/13 0525 03/18/13 0850  HGB 12.9  --  12.9 13.0  HCT 37.0  --  37.3 38.3  PLT 177  --  174 175  LABPROT 16.5*  --   --   --   INR 1.37  --   --   --   HEPARINUNFRC  --  0.66 0.61  --     CrCl is unknown because no creatinine reading has been taken.   Assessment: 72 y/o female on chronic Coumadin for AVR previously on a heparin drip while Coumadin held for vertebroplasty. Pharmacy consulted to resume heparin s/p surgery. Last heparin level was at goal on 800 units/hr. INR is 1.37 on 7/1 and she has had no Coumadin since.   Goal of Therapy:  INR 2-3 Heparin level 0.3-0.7 units/ml  Monitor platelets by anticoagulation protocol: Yes   Plan:  -Heparin drip IV at 800 units/hr with no bolus -Heparin level 6 hours after started -Daily heparin level and CBC -Coumadin management per MD  [facility], Pharm.D., BCPS Clinical Pharmacist Pager: [phone] 03/18/2013 3:24 PM

## 2013-03-18 NOTE — Op Note (Signed)
Preop diagnosis: Compression fracture T7-T8 Postop diagnosis: Same Procedure: T7 and T8 kyphoplasty Surgeon: Aspynn Clover  After and placed the prone position the patient's back was prepped and draped in the usual sterile fashion. AP and lateral fluoroscopy were used to brought into the field and aligned appropriately. We made stab wound incision just lateral to the pedicles at T7 and T8. We passed Jamshidi needle from lateral to medial and superior to inferior direction to the pedicles without difficulty. We then used a small device to make a cavity distal to the working channel and then delivered the cement in standard fashion. We got excellent fill at both levels though a small amount of cement back into the disc at T7-8 right in the middle which was not felt to be a problem. We had good fill at both levels we slowly withdrew the Jamshidi needle channels and impacted the cement to make sure there was not a cement tail. We then removed the needle to rest the way final fluoroscopy looked good. We washed the 2 small incisions with wet and dry sponges then closed with 2 staples at each level. Sterile Band-Aids were then applied and the patient was extubated and taken to recovery room in stable condition.

## 2013-03-18 NOTE — Progress Notes (Signed)
Patient ID: Marissa Brown, female   DOB: [birth year], 72 y.o.   MRN: [id number] Patient all set for her 2 level vertebroplasty. Will proceed as scheduled

## 2013-03-18 NOTE — Transfer of Care (Signed)
Immediate Anesthesia Transfer of Care Note  Patient: Marissa Brown  Procedure(s) Performed: Procedure(s): Thoracic Seven,Thoracaic Eight  VERTEBROPLASTY (N/A)  Patient Location: PACU  Anesthesia Type:General  Level of Consciousness: awake, alert  and oriented  Airway & Oxygen Therapy: Patient Spontanous Breathing and Patient connected to nasal cannula oxygen  Post-op Assessment: Report given to PACU RN, Post -op Vital signs reviewed and stable and Patient moving all extremities  Post vital signs: Reviewed and stable  Complications: No apparent anesthesia complications

## 2013-03-18 NOTE — Anesthesia Postprocedure Evaluation (Signed)
  Anesthesia Post-op Note  Patient: Marissa Brown  Procedure(s) Performed: Procedure(s): Thoracic Seven,Thoracaic Eight  VERTEBROPLASTY (N/A)  Patient Location: PACU  Anesthesia Type:General  Level of Consciousness: awake, alert , oriented and patient cooperative  Airway and Oxygen Therapy: Patient Spontanous Breathing and Patient connected to nasal cannula oxygen  Post-op Pain: mild  Post-op Assessment: Post-op Vital signs reviewed, Patient's Cardiovascular Status Stable, Respiratory Function Stable, Patent Airway, No signs of Nausea or vomiting and Pain level controlled  Post-op Vital Signs: Reviewed and stable  Complications: No apparent anesthesia complications

## 2013-03-18 NOTE — Progress Notes (Signed)
Pt to OR via bed.  Marissa Brown [employer]

## 2013-03-19 NOTE — Discharge Summary (Signed)
Physician Discharge Summary  Patient ID: Marissa Brown MRN: [id number] DOB/AGE: Jan 27, 1941 73 y.o.  Admit date: 03/15/2013 Discharge date: 03/19/2013  Admission Diagnoses:fracture thoracic spine  Discharge Diagnoses: same   Discharged Condition: no pain  Hospital Course: surgery  Consults none  Significant Diagnostic Studies: mri  Treatments: vertebroplasty  Discharge Exam: Blood pressure 144/59, pulse 58, temperature 98.1 F (36.7 C), temperature source Oral, resp. rate 16, height 5\' 6"  (1.676 m), weight 64.6 kg (142 lb 6.7 oz), SpO2 98.00%. Ambulating, no pain  Disposition: home. To star cuoumadin and see her PCP for blood tests. To see dr Gerlene Fee in 2 weeks     Medication List    ASK your doctor about these medications       calcium citrate-vitamin D 315-200 MG-UNIT per tablet  Commonly known as:  CITRACAL+D  Take 1 tablet by mouth daily.     celecoxib 200 MG capsule  Commonly known as:  CELEBREX  Take 200 mg by mouth daily.     diphenhydramine-acetaminophen 25-500 MG Tabs  Commonly known as:  TYLENOL PM  Take 1 tablet by mouth at bedtime.     lisinopril-hydrochlorothiazide 20-25 MG per tablet  Commonly known as:  PRINZIDE,ZESTORETIC  Take 0.5 tablets by mouth daily.     simvastatin 40 MG tablet  Commonly known as:  ZOCOR  Take 40 mg by mouth every evening.     warfarin 5 MG tablet  Commonly known as:  COUMADIN  Take 5-7.5 mg by mouth daily. Take 1 tablet on Tuesday and Thursday.  Take 1.5 tablet (7.5mg ) all other days         Signed: Deklan Minar M 03/19/2013, 9:34 AM

## 2013-03-19 NOTE — Progress Notes (Signed)
ANTICOAGULATION CONSULT NOTE - Follow Up Consult  Pharmacy Consult for heparin Indication: AVR  Labs:  Recent Labs  03/16/13 1430 03/16/13 2248 03/17/13 0525 03/18/13 0850 03/18/13 2305  HGB 12.9  --  12.9 13.0  --   HCT 37.0  --  37.3 38.3  --   PLT 177  --  174 175  --   LABPROT 16.5*  --   --   --   --   INR 1.37  --   --   --   --   HEPARINUNFRC  --  0.66 0.61  --  0.32    Assessment/Plan:  72yo female therapeutic on heparin after resumed post vertebroplasty.  At low end of goal though prior to being held had been near high end of goal so expect level wll continue to rise.  Will continue gtt at current rate and confirm stable with am labs.  Vernard Gambles, PharmD, BCPS  03/19/2013,12:06 AM

## 2013-03-23 ENCOUNTER — Encounter (HOSPITAL_COMMUNITY): Payer: Self-pay | Admitting: Neurosurgery

## 2014-07-03 ENCOUNTER — Emergency Department (HOSPITAL_BASED_OUTPATIENT_CLINIC_OR_DEPARTMENT_OTHER)
Admission: EM | Admit: 2014-07-03 | Discharge: 2014-07-03 | Disposition: A | Discharge status: Home / Self Care | Payer: Medicare PPO | Attending: Emergency Medicine | Admitting: Emergency Medicine

## 2014-07-03 ENCOUNTER — Encounter (HOSPITAL_BASED_OUTPATIENT_CLINIC_OR_DEPARTMENT_OTHER): Payer: Self-pay | Admitting: Emergency Medicine

## 2014-07-03 DIAGNOSIS — Z8669 Personal history of other diseases of the nervous system and sense organs: Secondary | ICD-10-CM | POA: Insufficient documentation

## 2014-07-03 DIAGNOSIS — M5126 Other intervertebral disc displacement, lumbar region: Secondary | ICD-10-CM

## 2014-07-03 DIAGNOSIS — Z87891 Personal history of nicotine dependence: Secondary | ICD-10-CM | POA: Diagnosis not present

## 2014-07-03 DIAGNOSIS — Z9889 Other specified postprocedural states: Secondary | ICD-10-CM | POA: Diagnosis not present

## 2014-07-03 DIAGNOSIS — M5186 Other intervertebral disc disorders, lumbar region: Secondary | ICD-10-CM | POA: Diagnosis not present

## 2014-07-03 DIAGNOSIS — Z8719 Personal history of other diseases of the digestive system: Secondary | ICD-10-CM | POA: Insufficient documentation

## 2014-07-03 DIAGNOSIS — Z7901 Long term (current) use of anticoagulants: Secondary | ICD-10-CM | POA: Insufficient documentation

## 2014-07-03 DIAGNOSIS — Z79899 Other long term (current) drug therapy: Secondary | ICD-10-CM | POA: Diagnosis not present

## 2014-07-03 DIAGNOSIS — M549 Dorsalgia, unspecified: Secondary | ICD-10-CM | POA: Diagnosis present

## 2014-07-03 DIAGNOSIS — E785 Hyperlipidemia, unspecified: Secondary | ICD-10-CM | POA: Insufficient documentation

## 2014-07-03 MED ORDER — ORPHENADRINE CITRATE ER 100 MG PO TB12: 100.0000 mg | ORAL_TABLET | Freq: Two times a day (BID) | ORAL | Status: DC

## 2014-07-03 MED ORDER — ONDANSETRON 4 MG PO TBDP: ORAL_TABLET | ORAL | Status: DC

## 2014-07-03 MED ORDER — HYDROCODONE-ACETAMINOPHEN 5-325 MG PO TABS: 1.0000 | ORAL_TABLET | ORAL | Status: DC | PRN

## 2014-07-03 NOTE — ED Notes (Signed)
Wednesday she was trying to open a window and felt a "pop" in her back and has been having back pain since.

## 2014-07-03 NOTE — Discharge Instructions (Signed)
Herniated Disk A herniated disk occurs when a disk in your spine bulges out too far. This condition is also called a ruptured disk or slipped disk. Your spine (backbone) is made up of bones called vertebrae. Between each pair of vertebrae is an oval disk with a soft, spongy center that acts as a shock absorber when you move. The spongy center is surrounded by a tough outer ring. When you have a herniated disk, the spongy center of the disk bulges out or ruptures through the outer ring. A herniated disk can press on a nerve between your vertebrae and cause pain. A herniated disk can occur anywhere in your back or neck area, but the lower back is the most common spot. CAUSES  In many cases, a herniated disk occurs just from getting older. As you age, the spongy insides of your disks tend to shrink and dry out. A herniated disk can result from gradual wear and tear. Injury or sudden strain can also cause a herniated disk.  RISK FACTORS Aging is the main risk factor for a herniated disk. Other risk factors include:  Being a man between the ages of 30 and 50 years.  Having a job that requires heavy lifting, bending, or twisting.  Having a job that requires long hours of driving.  Not getting enough exercise.  Being overweight.  Smoking. SIGNS AND SYMPTOMS  Signs and symptoms depend on which disk is herniated.  For a herniated disk in the lower back, you may have sharp pain in:  One part of your leg, hip, or buttocks.  The back of your calf.  The top or sole of your foot (sciatica).   For a herniated disk in the neck, you may feel pain:  When you move your neck.  Near or over your shoulder blade.  That moves to your upper arm, forearm, or fingers.   You may also have muscle weakness. It may be hard to:  Lift your leg or arm.  Stand on your toes.  Squeeze tightly with one of your hands.  Other symptoms can include:  Numbness or tingling in the affected areas of your  body.  Loss of bladder or bowel control. This is a rare but serious sign of a severe herniated disk in the lower back. DIAGNOSIS  Your health care provider will do a physical exam. During this exam, you may have to move certain body parts or assume various positions. For example, your health care provider may do the straight-leg test. This is a good way to test for a herniated disk in your lower back. In this test, the health care provider lifts your leg while you lie on your back. This is to see if you feel pain down your leg. Your health care provider will also check for numbness or loss of feeling.  Your health care provider will also check your:  Reflexes.  Muscle strength.  Posture.  Other tests may be done to help in making a diagnosis. These may include:  An X-ray of the spine to rule out other causes of back pain.   Other imaging studies, such as an MRI or CT scan. This is to check whether the herniated disk is pressing on your spinal canal.  Electromyography (EMG). This test checks the nerves that control muscles. It is sometimes used to identify the specific area of nerve involvement.  TREATMENT  In many cases, herniated disk symptoms go away over a period of days or weeks. You will most   likely be free of symptoms in 3-4 months. Treatment may include the following:  The initial treatment for a herniated disk is ashort period of rest.  Bed rest is often limited to 1 or 2 days. Resting for too long delays recovery.  If you have a herniated disk in your lower back, you should avoid sitting as much as possible because sitting increases pressure on the disk.  Medicines. These may include:   Nonsteroidal anti-inflammatory drugs (NSAIDs).  Muscle relaxants for back spasms.  Narcotic pain medicine if your pain is very bad.   Steroid injections. You may need these along the involved nerve root to help control pain. The steroid is injected in the area of the herniated disk.  It helps by reducing swelling around the disk.  Physical therapy. This may include exercises to strengthen the muscles that help support your spine.   You may need surgery if other treatments do not work.  HOME CARE INSTRUCTIONS Follow all your health care provider's instructions. These may include:  Take all medicines as directed by your health care provider.  Rest for 2 days and then start moving.  Do not sit or stand for long periods of time.  Maintain good posture when sitting and standing.  Avoid movements that cause pain, such as bending or lifting.  When you are able to start lifting things again:  Bend with your knees.  Keep your back straight.  Hold heavy objects close to your body.  If you are overweight, ask your health care provider to help you start a weight-loss program.  When you are able to start exercising, ask your health care provider how much and what type of exercise is best for you.  Work with a physical therapist on stretching and strengthening exercises for your back.  Do not wear high-heeled shoes.  Do not sleep on your belly.  Do not smoke.  Keep all follow-up visits as directed by your health care provider. SEEK MEDICAL CARE IF:  You have back or neck pain that is not getting better after 4 weeks.  You have very bad pain in your back or neck.  You develop numbness, tingling, or weakness along with pain. SEEK IMMEDIATE MEDICAL CARE IF:   You have numbness, tingling, or weakness that makes you unable to use your arms or legs.  You lose control of your bladder or bowels.  You have dizziness or fainting.  You have shortness of breath.  MAKE SURE YOU:   Understand these instructions.  Will watch your condition.  Will get help right away if you are not doing well or get worse. Document Released: 08/30/2000 Document Revised: 01/17/2014 Document Reviewed: 08/06/2013 Exit[employer] Patient Information 2015 [employer]. This information  is not intended to replace advice given to you by your health care provider. Make sure you discuss any questions you have with your health care provider.  

## 2014-07-03 NOTE — ED Provider Notes (Signed)
CSN: [id number]     Arrival date & time 07/03/14  1614 History   First MD Initiated Contact with Patient 07/03/14 1650     Chief Complaint  Patient presents with  . Back Pain     (Consider location/radiation/quality/duration/timing/severity/associated sxs/prior Treatment) HPI Wednesday the patient was trying to bend forward leaning over a table opening a window. She felt a pop in her back and has had sharp pain in that area ever since. The pain is worse with prolonged sitting position, the best position is flat on her back. The patient reports however sometimes up and standing and moving slightly is also an improvement. She reports that she stoops to been something however showed a severe pain. The pain is in the low back at approximately the L5 region. She denies any lower shotty weakness or numbness. There's been no bowel or bladder dysfunction. The patient has taken some ibuprofen however she is not supposed to take much due to the fact she takes warfarin. Past Medical History  Diagnosis Date  . Hyperlipidemia   . Osteoarthritis of both knees     and hands  . Cubital tunnel syndrome   . Diverticulosis   . Cataract     Right Eye   Past Surgical History  Procedure Laterality Date  . Cholecystectomy    . Appendectomy    . Tubal ligation    . Shoulder debridement Left     Due to Calcium Deposit  . Cardiac valve replacement  1981    Aortic Valve   . Aortic heart valve  1981    patient must have antibiotics prior to surgical procedures  . Vertebroplasty N/A 03/18/2013    Procedure: Thoracic Seven,Thoracaic Eight  VERTEBROPLASTY;  Surgeon: Faythe Ghee, MD;  Location: [facility];  Service: Neurosurgery;  Laterality: N/A;  . Back surgery     Family History  Problem Relation Age of Onset  . Heart disease Father   . Heart disease Paternal Uncle    History  Substance Use Topics  . Smoking status: Former Smoker    Types: Cigarettes  . Smokeless tobacco: Never Used  . Alcohol  Use: Yes     Comment: Occassional   OB History   Grav Para Term Preterm Abortions TAB SAB Ect Mult Living                 Review of Systems 10 Systems reviewed and are negative for acute change except as noted in the HPI.    Allergies  Review of patient's allergies indicates no known allergies.  Home Medications   Prior to Admission medications   Medication Sig Start Date End Date Taking? Authorizing Provider  calcium citrate-vitamin D (CITRACAL+D) 315-200 MG-UNIT per tablet Take 1 tablet by mouth daily.    Historical Provider, MD  celecoxib (CELEBREX) 200 MG capsule Take 200 mg by mouth daily.     Historical Provider, MD  diphenhydramine-acetaminophen (TYLENOL PM) 25-500 MG TABS Take 1 tablet by mouth at bedtime.    Historical Provider, MD  HYDROcodone-acetaminophen (NORCO/VICODIN) 5-325 MG per tablet Take 1-2 tablets by mouth every 4 (four) hours as needed for moderate pain or severe pain. 07/03/14   Charlesetta Shanks, MD  lisinopril-hydrochlorothiazide (PRINZIDE,ZESTORETIC) 20-25 MG per tablet Take 0.5 tablets by mouth daily.    Historical Provider, MD  ondansetron (ZOFRAN ODT) 4 MG disintegrating tablet 4mg  ODT q4 hours prn nausea/vomit 07/03/14   Charlesetta Shanks, MD  orphenadrine (NORFLEX) 100 MG tablet Take 1 tablet (100 mg  total) by mouth 2 (two) times daily. 07/03/14   Charlesetta Shanks, MD  simvastatin (ZOCOR) 40 MG tablet Take 40 mg by mouth every evening.    Historical Provider, MD  warfarin (COUMADIN) 5 MG tablet Take 5-7.5 mg by mouth daily. Take 1 tablet on Tuesday and Thursday.  Take 1.5 tablet (7.5mg ) all other days    Historical Provider, MD   BP 149/68  Pulse 66  Temp(Src) 98.4 F (36.9 C) (Oral)  Resp 18  Ht 5\' 6"  (1.676 m)  Wt 139 lb (63.05 kg)  BMI 22.45 kg/m2  SpO2 96% Physical Exam  Constitutional: She appears well-developed and well-nourished.  HENT:  Head: Normocephalic and atraumatic.  Eyes: EOM are normal.  Neck: Neck supple.  Cardiovascular: Normal  rate and regular rhythm.   Pulmonary/Chest: Effort normal and breath sounds normal. No respiratory distress. She has no wheezes. She has no rales.  Abdominal: Soft. Bowel sounds are normal. She exhibits no distension and no mass. There is no tenderness. There is no rebound and no guarding.  Musculoskeletal: Normal range of motion. She exhibits no edema and no tenderness.  The patient does have pain that reproduces with position change going from supine to sitting. In order to make the transition she first has to roll onto her side and then bring herself into an upright position. There is however no reproducible bony point tenderness over the spine. There is no associated muscle spasm.  Neurological: She is alert.  Patient has normal 5 out of 5 lower extremity strength as well as normal sensory exam.  Skin: Skin is warm and dry.  Psychiatric: She has a normal mood and affect.    ED Course  Procedures (including critical care time) Labs Review Labs Reviewed - No data to display  Imaging Review No results found.   EKG Interpretation None      MDM   Final diagnoses:  HNP (herniated nucleus pulposus), lumbar    At this point the patient's history and examination is consistent with either disc herniation without radiculopathy or muscular strain. I doubt an acute compression fracture given that there is no reproducible bony point tenderness. Patient will be treated for pain control with Vicodin, she cannot use it sustained ibuprofen 2 to been anticoagulated. I will also provide her for with Zofran as once in the past she experienced nausea in association with using Percocet. The patient will followup with her family physician for reassessment next week. She has been counseled on neurologic symptoms to return if there should be weakness numbness bowel or bladder dysfunction.    Charlesetta Shanks, MD 07/03/14 [id number]

## 2014-08-22 ENCOUNTER — Other Ambulatory Visit: Payer: Self-pay | Admitting: Neurosurgery

## 2014-08-22 DIAGNOSIS — M549 Dorsalgia, unspecified: Secondary | ICD-10-CM

## 2014-09-12 ENCOUNTER — Ambulatory Visit
Admission: RE | Admit: 2014-09-12 | Discharge: 2014-09-12 | Disposition: A | Discharge status: Home / Self Care | Payer: Medicare PPO | Source: Ambulatory Visit | Attending: Neurosurgery | Admitting: Neurosurgery

## 2014-09-12 DIAGNOSIS — M549 Dorsalgia, unspecified: Secondary | ICD-10-CM

## 2014-09-12 MED ORDER — IOHEXOL 300 MG/ML  SOLN
1.0000 mL | Freq: Once | INTRAMUSCULAR | Status: AC | PRN
  Administered 2014-09-12: 1 mL via EPIDURAL

## 2014-09-12 MED ORDER — TRIAMCINOLONE ACETONIDE 40 MG/ML IJ SUSP (RADIOLOGY)
60.0000 mg | Freq: Once | INTRAMUSCULAR | Status: AC
  Administered 2014-09-12: 60 mg via EPIDURAL

## 2014-09-12 NOTE — Discharge Instructions (Signed)
Post Procedure Spinal Discharge Instruction Sheet  1. You may resume a regular diet and any medications that you routinely take (including pain medications).  2. No driving day of procedure.  3. Light activity throughout the rest of the day.  Do not do any strenuous work, exercise, bending or lifting.  The day following the procedure, you can resume normal physical activity but you should refrain from exercising or physical therapy for at least three days thereafter.   Common Side Effects:   Headaches- take your usual medications as directed by your physician.  Increase your fluid intake.  Caffeinated beverages may be helpful.  Lie flat in bed until your headache resolves.   Restlessness or inability to sleep- you may have trouble sleeping for the next few days.  Ask your referring physician if you need any medication for sleep.   Facial flushing or redness- should subside within a few days.   Increased pain- a temporary increase in pain a day or two following your procedure is not unusual.  Take your pain medication as prescribed by your referring physician.   Leg cramps  Please contact our office at [phone] for the following symptoms:  Fever greater than 100 degrees.  Headaches unresolved with medication after 2-3 days.  Increased swelling, pain, or redness at injection site.  Thank you for visiting our office.  May resume Coumadin today.

## 2014-10-19 ENCOUNTER — Other Ambulatory Visit: Payer: Self-pay | Admitting: Neurosurgery

## 2014-10-19 DIAGNOSIS — M5414 Radiculopathy, thoracic region: Secondary | ICD-10-CM

## 2014-10-27 ENCOUNTER — Ambulatory Visit
Admission: RE | Admit: 2014-10-27 | Discharge: 2014-10-27 | Disposition: A | Discharge status: Home / Self Care | Payer: Medicare PPO | Source: Ambulatory Visit | Attending: Neurosurgery | Admitting: Neurosurgery

## 2014-10-27 DIAGNOSIS — M5414 Radiculopathy, thoracic region: Secondary | ICD-10-CM

## 2014-10-27 MED ORDER — TRIAMCINOLONE ACETONIDE 40 MG/ML IJ SUSP (RADIOLOGY)
60.0000 mg | Freq: Once | INTRAMUSCULAR | Status: AC
  Administered 2014-10-27: 60 mg via EPIDURAL

## 2014-10-27 MED ORDER — IOHEXOL 300 MG/ML  SOLN
1.0000 mL | Freq: Once | INTRAMUSCULAR | Status: AC | PRN
Start: 2014-10-27 — End: 2014-10-27
  Administered 2014-10-27: 1 mL via EPIDURAL

## 2014-10-27 NOTE — Progress Notes (Signed)
Pt encouraged to resume blood thinner as ordered by [facility].

## 2015-04-04 IMAGING — CR DG THORACIC SPINE 3V
3 series · 3 of 3 positions shown · non-contrast
Comparison: None.

CLINICAL DATA: Back pain after fall

THORACIC SPINE - 2 VIEW + SWIMMERS

[w t-spine a.p. *]
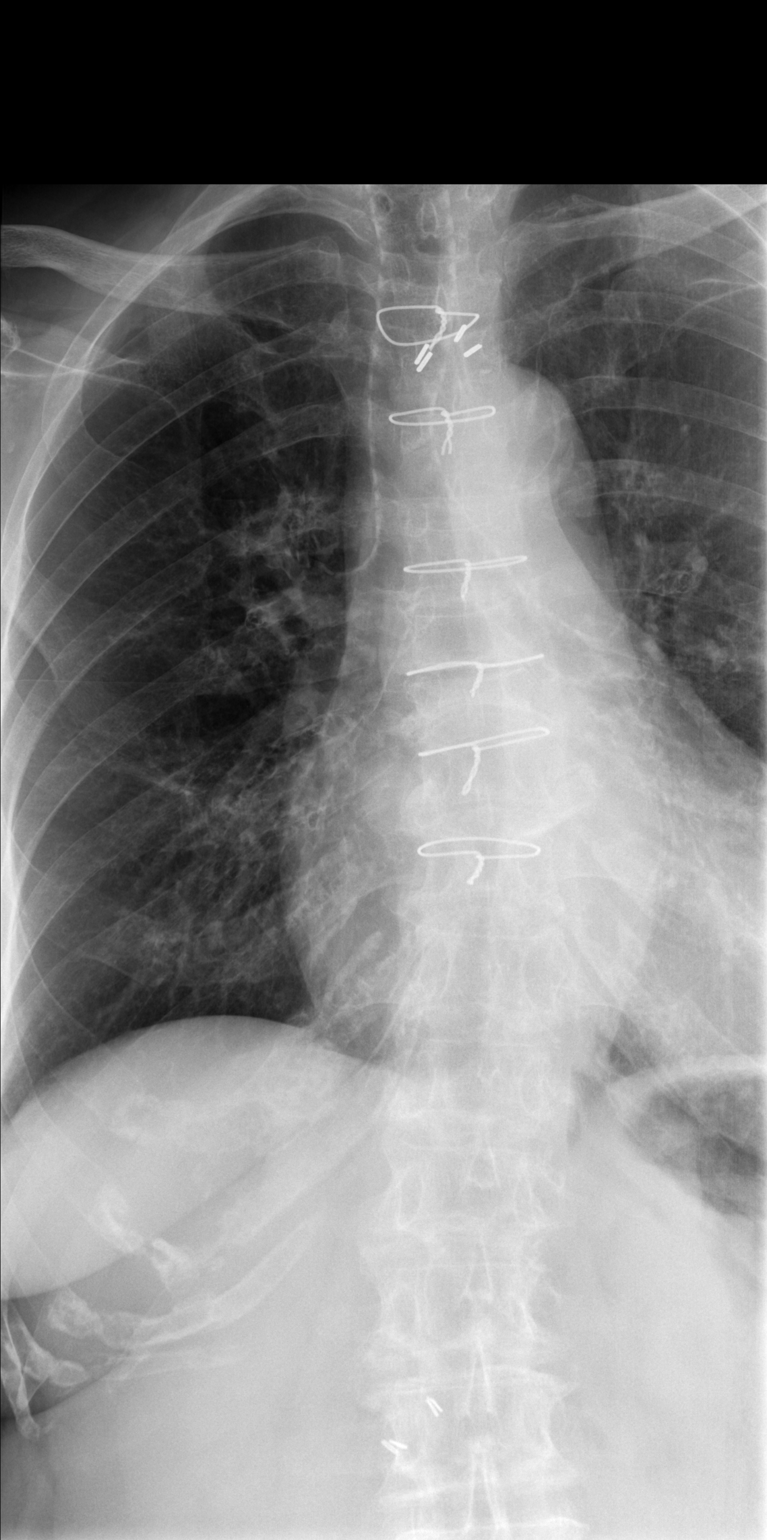

[w t-spine lat *]
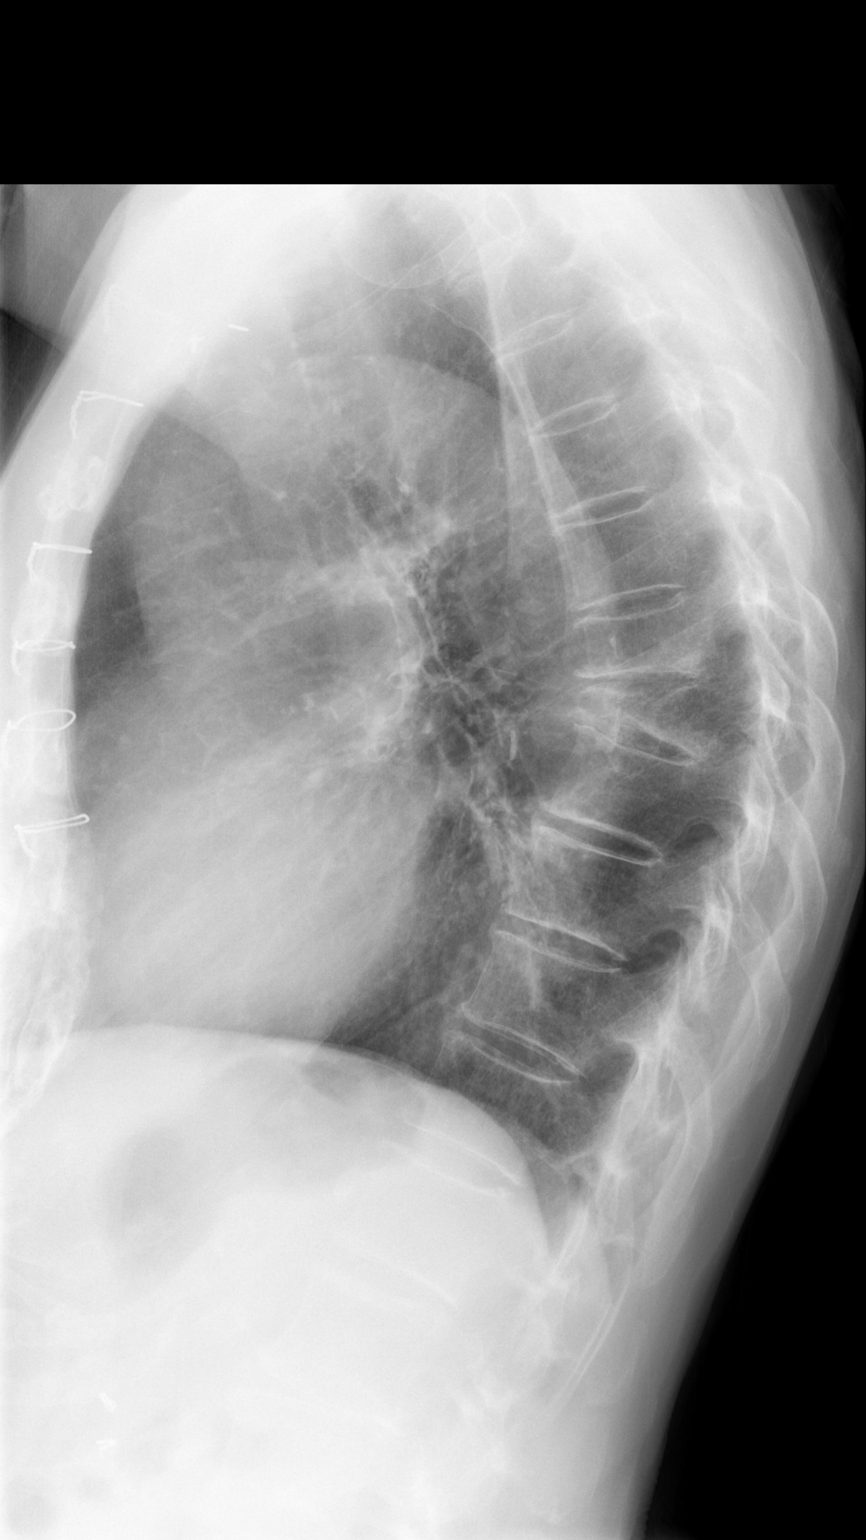

[w swimmers view]
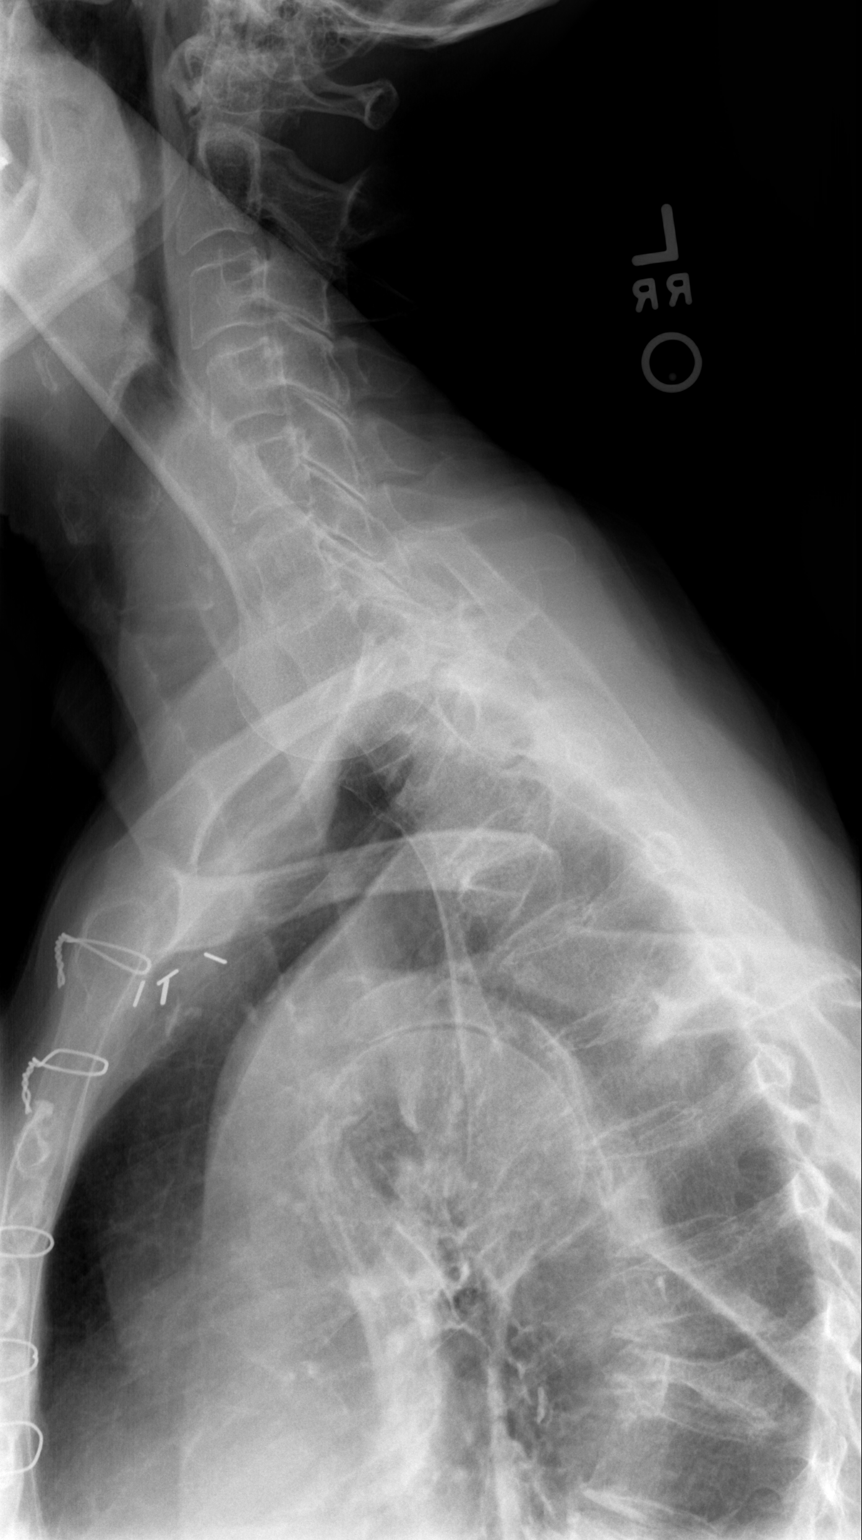

[3 of 3 positions shown; findings below may reference images not displayed]

FINDINGS: Severe compression deformity of T8 vertebral body is
noted consistent with fracture of indeterminate age, but most
likely subacute given the patient's history. Mild loss of height is
seen involving T7 vertebral body most consistent with old
compression fracture.  Disc spaces appear to be well maintained.
No significant spondylolisthesis is noted.
IMPRESSION: Severe compression deformity of T8 vertebral body most likely
representing subacute fracture given the patient's history.

## 2017-10-03 ENCOUNTER — Encounter (HOSPITAL_COMMUNITY): Payer: Self-pay

## 2017-10-03 ENCOUNTER — Emergency Department (HOSPITAL_COMMUNITY)
Admission: EM | Admit: 2017-10-03 | Discharge: 2017-10-03 | Disposition: A | Discharge status: Home / Self Care | Payer: Medicare Other | Attending: Emergency Medicine | Admitting: Emergency Medicine

## 2017-10-03 ENCOUNTER — Emergency Department (HOSPITAL_COMMUNITY): Payer: Medicare Other

## 2017-10-03 DIAGNOSIS — Z87891 Personal history of nicotine dependence: Secondary | ICD-10-CM | POA: Insufficient documentation

## 2017-10-03 DIAGNOSIS — R202 Paresthesia of skin: Secondary | ICD-10-CM | POA: Insufficient documentation

## 2017-10-03 DIAGNOSIS — Z7901 Long term (current) use of anticoagulants: Secondary | ICD-10-CM | POA: Diagnosis not present

## 2017-10-03 DIAGNOSIS — M25462 Effusion, left knee: Secondary | ICD-10-CM

## 2017-10-03 DIAGNOSIS — Z79899 Other long term (current) drug therapy: Secondary | ICD-10-CM | POA: Insufficient documentation

## 2017-10-03 DIAGNOSIS — M25562 Pain in left knee: Secondary | ICD-10-CM | POA: Diagnosis present

## 2017-10-03 LAB — CBC WITH DIFFERENTIAL/PLATELET
BASOS ABS: 0 10*3/uL (ref 0.0–0.1)
Basophils Relative: 0 %
EOS ABS: 0.1 10*3/uL (ref 0.0–0.7)
EOS PCT: 0 %
HCT: 40.1 % (ref 36.0–46.0)
Hemoglobin: 13.7 g/dL (ref 12.0–15.0)
LYMPHS PCT: 5 %
Lymphs Abs: 0.8 10*3/uL (ref 0.7–4.0)
MCH: 33.3 pg (ref 26.0–34.0)
MCHC: 34.2 g/dL (ref 30.0–36.0)
MCV: 97.3 fL (ref 78.0–100.0)
Monocytes Absolute: 0.8 10*3/uL (ref 0.1–1.0)
Monocytes Relative: 5 %
Neutro Abs: 13 10*3/uL — ABNORMAL HIGH (ref 1.7–7.7)
Neutrophils Relative %: 90 %
PLATELETS: 223 10*3/uL (ref 150–400)
RBC: 4.12 MIL/uL (ref 3.87–5.11)
RDW: 12.5 % (ref 11.5–15.5)
WBC: 14.7 10*3/uL — AB (ref 4.0–10.5)

## 2017-10-03 LAB — PROTIME-INR
INR: 2.41
Prothrombin Time: 26 seconds — ABNORMAL HIGH (ref 11.4–15.2)

## 2017-10-03 MED ORDER — OXYCODONE-ACETAMINOPHEN 5-325 MG PO TABS
1.0000 | ORAL_TABLET | Freq: Once | ORAL | Status: AC
  Administered 2017-10-03: 1 via ORAL
  Filled 2017-10-03: qty 1

## 2017-10-03 MED ORDER — HYDROCODONE-ACETAMINOPHEN 5-325 MG PO TABS: 1.0000 | ORAL_TABLET | Freq: Four times a day (QID) | ORAL | 0 refills | Status: DC | PRN

## 2017-10-03 NOTE — ED Notes (Signed)
Bed: WA20 Expected date:  Expected time:  Means of arrival:  Comments: 77 yr old knee swelling

## 2017-10-03 NOTE — Discharge Instructions (Signed)
Please rest, apply ice, apply compression and elevate your knee at home to help with swelling.  Take vicodin as needed for pain.  Follow up closely with your orthpedist for further management of your joint swelling.

## 2017-10-03 NOTE — ED Notes (Signed)
Pt complains of left knee swelling She's had fluid drained from that knee before Her doctor in [facility] is in surgery and wanted her to come to [facility] ER but they aren't accepting any patients

## 2017-10-03 NOTE — ED Provider Notes (Signed)
[facility] DEPT Provider Note   CSN: [id number] Arrival date & time: 10/03/17  0623     History   Chief Complaint Chief Complaint  Patient presents with  . Joint Swelling    HPI Marissa Brown is a 77 y.o. female.  HPI   77 year old female with history of osteoarthritis brought here via EMS from home for evaluation of knee swelling.  Patient developed pain and swelling to her left knee since last night.  Pain described as a sharp throbbing sensation, persistent, worsening with movement.  She cannot bend her knee secondary to the pain and now complaining of tingling sensation to her toes on the left foot.  She is unable to ambulate secondary to the pain.  She mentioned having significant osteoarthritis involving her knees.  In November she had one similar atraumatic episode where her left knee became swollen.  She was seen by her orthopedist, Dr. Charna Archer who performed a knee aspiration as well as injecting medication into the joint which helps.  She has been mostly symptom-free until last night.  She report having joint injections several times in the past for the same knee.  She did try to call her orthopedist but her next available appointment is in a week.  She decided to contact EMS to bring her here for further management.  She denies having fever, back pain, weakness or rash.  Denies any recent trauma.  She is currently on Coumadin and states that her last INR a week ago was normal.  No dietary changes or medication changes.  Past Medical History:  Diagnosis Date  . Cataract    Right Eye  . Cubital tunnel syndrome   . Diverticulosis   . Hyperlipidemia   . Osteoarthritis of both knees    and hands    Patient Active Problem List   Diagnosis Date Noted  . Compression fracture of thoracic vertebra (HCC) 02/13/2013  . Back pain, thoracic 02/13/2013    Past Surgical History:  Procedure Laterality Date  . aortic heart valve  1981   patient must  have antibiotics prior to surgical procedures  . APPENDECTOMY    . BACK SURGERY    . CARDIAC VALVE REPLACEMENT  1981   Aortic Valve   . CHOLECYSTECTOMY    . SHOULDER DEBRIDEMENT Left    Due to Calcium Deposit  . TUBAL LIGATION    . VERTEBROPLASTY N/A 03/18/2013   Procedure: Thoracic Seven,Thoracaic Eight  VERTEBROPLASTY;  Surgeon: Faythe Ghee, MD;  Location: [facility];  Service: Neurosurgery;  Laterality: N/A;    OB History    No data available       Home Medications    Prior to Admission medications   Medication Sig Start Date End Date Taking? Authorizing Provider  calcium citrate-vitamin D (CITRACAL+D) 315-200 MG-UNIT per tablet Take 1 tablet by mouth daily.    [provider]  celecoxib (CELEBREX) 200 MG capsule Take 200 mg by mouth daily.     [provider]  diphenhydramine-acetaminophen (TYLENOL PM) 25-500 MG TABS Take 1 tablet by mouth at bedtime.    [provider]  HYDROcodone-acetaminophen (NORCO/VICODIN) 5-325 MG per tablet Take 1-2 tablets by mouth every 4 (four) hours as needed for moderate pain or severe pain. 07/03/14   Charlesetta Shanks, MD  lisinopril-hydrochlorothiazide (PRINZIDE,ZESTORETIC) 20-25 MG per tablet Take 0.5 tablets by mouth daily.    [provider]  ondansetron (ZOFRAN ODT) 4 MG disintegrating tablet 4mg  ODT q4 hours prn nausea/vomit  07/03/14   Charlesetta Shanks, MD  orphenadrine (NORFLEX) 100 MG tablet Take 1 tablet (100 mg total) by mouth 2 (two) times daily. 07/03/14   Charlesetta Shanks, MD  simvastatin (ZOCOR) 40 MG tablet Take 40 mg by mouth every evening.    [provider]  warfarin (COUMADIN) 5 MG tablet Take 5-7.5 mg by mouth daily. Take 1 tablet on Tuesday and Thursday.  Take 1.5 tablet (7.5mg ) all other days    [provider]    Family History Family History  Problem Relation Age of Onset  . Heart disease Father   . Heart disease Paternal Uncle     Social History Social History     Tobacco Use  . Smoking status: Former Smoker    Types: Cigarettes  . Smokeless tobacco: Never Used  Substance Use Topics  . Alcohol use: Yes    Comment: Occassional  . Drug use: No     Allergies   Patient has no known allergies.   Review of Systems Review of Systems  Constitutional: Negative for fever.  Musculoskeletal: Positive for joint swelling.  Skin: Negative for rash and wound.  Neurological: Positive for numbness.     Physical Exam Updated Vital Signs BP (!) 184/85 (BP Location: Left Arm)   Pulse 76   Temp 98.2 F (36.8 C) (Oral)   Resp 18   SpO2 96%   Physical Exam  Constitutional: She appears well-developed and well-nourished. No distress.  HENT:  Head: Atraumatic.  Eyes: Conjunctivae are normal.  Neck: Neck supple.  Musculoskeletal: She exhibits tenderness (Left knee: The knee is moderately edematous and diffusely tender to palpation, increasing pain with knee flexion.  No deformity.  Patella is located.).  No pain to left hip or left ankle.  Neurological: She is alert.  Sensation is intact throughout left lower extremity including foot with brisk cap refill.  Skin: No rash noted.  Psychiatric: She has a normal mood and affect.  Nursing note and vitals reviewed.    ED Treatments / Results  Labs (all labs ordered are listed, but only abnormal results are displayed) Labs Reviewed  PROTIME-INR - Abnormal; Notable for the following components:      Result Value   Prothrombin Time 26.0 (*)    All other components within normal limits  CBC WITH DIFFERENTIAL/PLATELET - Abnormal; Notable for the following components:   WBC 14.7 (*)    Neutro Abs 13.0 (*)    All other components within normal limits    EKG  EKG Interpretation None       Radiology Dg Knee Complete 4 Views Left  Result Date: 10/03/2017 CLINICAL DATA:  Soft tissue swelling EXAM: LEFT KNEE - COMPLETE 4+ VIEW COMPARISON:  None. FINDINGS: Frontal, lateral, and bilateral oblique  views were obtained. There is no fracture or dislocation. There is a large joint effusion. There is moderately severe joint space narrowing in the patellofemoral joint region. There is moderate narrowing medially. There is spurring in all compartments. No erosive change. There is generalized soft tissue swelling, most notably medially. There is extensive calcification in the superficial femoral, popliteal, and trifurcation arterial vessel regions. IMPRESSION: Large joint effusion. Osteoarthritic change most marked medially and in the patellofemoral joint regions. No fracture or dislocation. Multifocal arterial vascular calcifications/atherosclerosis. Electronically Signed   By: Lowella Grip III M.D.   On: 10/03/2017 09:05    Procedures Procedures (including critical care time)  Medications Ordered in ED Medications - No data to display   Initial Impression /  Assessment and Plan / ED Course  I have reviewed the triage vital signs and the nursing notes.  Pertinent labs & imaging results that were available during my care of the patient were reviewed by me and considered in my medical decision making (see chart for details).     BP (!) 158/99 (BP Location: Right Arm)   Pulse 88   Temp 98.2 F (36.8 C) (Oral)   Resp 16   SpO2 97%    Final Clinical Impressions(s) / ED Diagnoses   Final diagnoses:  Effusion of left knee    ED Discharge Orders        Ordered    HYDROcodone-acetaminophen (NORCO/VICODIN) 5-325 MG tablet  Every 6 hours PRN     10/03/17 0931     7:29 AM Patient with history of osteoarthritis here with left knee pain and swelling that started last night.  She is currently on warfarin.  Swallowing is likely hemarthrosis, atraumatic.  Low suspicion for septic joint, or acute fracture.  Care discussed with Dr. Ralene Bathe.  9:27 AM INR therapeutic at 2.41.  L knee xray shows large joint effusion.  No fx or dislocation.  Finding is expected based on exam.  She does have a  mildly elevated WBC of 14 but not likely from a septic joint.  After discussion with Dr. Ralene Bathe and Dr. Zenia Resides, we felt knee aspiration procedure today would not likely to be beneficial for pt as it will reaccumulate and the risk on infection increases.  Recommend rest, ice, elevation and compression.  Pain medication prescribed.  Pt will f/u with ortho for further management.  In order to decrease risk of narcotic abuse. Pt's record were checked using the [place] Controlled Substance database.     Domenic Moras, PA-C 10/03/17 0932    Quintella Reichert, MD 10/05/17 [phone]

## 2017-12-18 ENCOUNTER — Ambulatory Visit: Payer: Self-pay | Admitting: Orthopedic Surgery

## 2017-12-26 ENCOUNTER — Ambulatory Visit: Payer: Self-pay | Admitting: Orthopedic Surgery

## 2017-12-26 NOTE — H&P (Signed)
Marissa Brown, Marissa Brown [phone], F) DOB 1941/07/15    Chief Complaint Left Knee Pain H&P left total knee 01-19-2018  Patient's Care Team Primary Care Provider: Ival Bible MD: [address], [place], [place] [address], Ph [phone], Fax [phone] NPI: [id number] Cardiologist: Kathlen Brunswick: [address], [place], [place] [address], Ph [phone], Fax [phone] NPI: [id number] Patient's Pharmacies [place] [address] [facility]): [place], [place] [address], Ph [phone], Fax [phone]   Vitals Ht: 5 ft 5 in  Wt: 130 lbs BMI: 21.6  BP: 128/64    Allergies Reviewed Allergies NKDA   Medications Reviewed Medications celecoxib 200 mg capsule TK 1 C PO D 10/17/17   filled PRESCRIPTION SOLUTIONS hydroCHLOROthiazide 25 mg tablet TK 1/2 T PO BID 07/26/17   filled surescripts lisinopril 20 mg tablet TK 1/2 TO 1 T PO D FOR BP. DISCONTINUE THE MICARDIS. 12/25/17   filled PRESCRIPTION SOLUTIONS pravastatin 40 mg tablet 12/09/17   filled surescripts warfarin 7.5 mg tablet TK 1 T PO D EXCEPT 1/2 T ON TUESDAY AND THURSDAY OR AS DIRECTED 10/17/17   filled PRESCRIPTION SOLUTIONS  Problems Reviewed Problems No known problems Osteoarthritis of left knee joint - Onset: 11/08/2017  Family History Reviewed Family History   Social History Reviewed Social History Smoking Status: Former smoker Non-smoker Tobacco-years of use: 5 Chewing tobacco: none Alcohol intake: Occasional Hand Dominance: Right Work related injury?: N Advance directive: N Freight forwarder of Attorney: N   Surgical History Reviewed Surgical History Mechanical prosthetic aortic valve replacement - [facility] - 1980 Colonoscopy - 09/17/2007 Appendectomy - 09/17/2003 Hysterectomy - 09/17/2003 Cholecystectomy - 09/16/1993 Tubal ligation - 09/16/1966   Past Medical History Reviewed Past Medical History High Cholesterol: Y Hypertension: Y Irregular  Heartbeat: Y Osteoporosis: Y Previous Cortisone Injection(s): Y Weight loss: Y Notes: Rheumatic Fever, UTI, Skin Cancer, Irrgeular Heart Beat, Osteoporosis, History of Compression Fractures (3) Screening None recorded.   HPI The patient is here today for a pre-operative History and Physical. They are scheduled for left total knee replacement on 01-19-2018 with Dr. Wynelle Link at [facility]. [name] has been seen for ongoing knee pain. Patient reports weakness, swelling, and stiffness. She reports that the left knee has been aching, sharp pain, and worsening with time. She reports severe ("when I have pain it is bad, as long as I sit still I don't have any pain").  She has had problems with the LEFT knee for many years now. It has gotten progressively worse in the past year. She is a stage now with the knee is hurting with all activities. If she has a busy day it will also hurt her at night. She has had injections which are no longer beneficial. Exercise does not help anymore. She is at a stage she is ready to get this knee fixed as it is having a very negative effect on her life. Radiographs-AP and lateral both knees show that she has bone-on-bone arthritis medial and patellofemoral in the LEFT knee. Patient has significant pain and dysfunction in their knee. They have had injections and failed nonoperative management. At this point the pain and dysfunction have essentially taken over their life. The most predictable means of improving pain and function is total knee arthroplasty. She is ready to proceed with surgery at this time.   ROS CONSTITUTIONAL: WEIGHT LOSS, but no Fever, no Weight Gain  HEAD, EARS, EYES, NOSE, AND THROAT: no Dry Eyes, no  Eye Irritation, no Vision Changes, no Sore Throat  CARDIOVASCULAR: no Chest Pain / Angina, no Palpitations, no Poor Circulation, no Leg Swelling  RESPIRATORY: no Cough, no Shortness of Breath, no COPD, no Asthma  GASTROINTESTINAL: no Nausea /  Vomiting, no Black Stools / Blood in Stool, no Diarrhea, no Vomiting Blood  GENITOURINARY: no Blood in Urine, no Difficulty Urinating  MUSCULOSKELETAL: JOINT PAIN, JOINT SWELLING, LOST 2 OR MORE INCHES IN HEIGHT, but no Rheumatoid Arthritis  SKIN: no Rash, no Varicose Veins  NEUROLOGIC: no Numbness, no Seizures, no Dizziness, no Problems with Balance  PSYCHIATRIC: no Anxiety, no Depression  ENDOCRINE: no Heat Intolerance  BLOOD / LYMPH: no Anemia, no Bleed Easily, no Bruise Easily, no Swollen Glands  Physical Exam Patient is a 77 year old female.  General Mental Status - Alert, cooperative and good historian. General Appearance - pleasant, Not in acute distress. Orientation - Oriented X3. Build & Nutrition - Well nourished and Well developed.  Head and Neck Head - normocephalic, atraumatic . Neck Global Assessment - supple, no bruit auscultated on the right, no bruit auscultated on the left.  Eye Pupil - Bilateral - PERR Motion - Bilateral - EOMI.  Chest and Lung Exam Auscultation Breath sounds - clear at anterior chest wall and clear at posterior chest wall. Adventitious sounds - No Adventitious sounds.  Cardiovascular Auscultation Rhythm - Regular rate and rhythm. Heart Sounds - S1 WNL and S2 WNL. Murmurs & Other Heart Sounds - Auscultation of the heart reveals - No Murmurs.  Abdomen Palpation/Percussion Tenderness - Abdomen is non-tender to palpation. Abdomen is soft. Auscultation Auscultation of the abdomen reveals - Bowel sounds normal.  Genitourinary Note: Not done, not pertinent to present illness  Musculoskeletal She has normal range of motion of both hips without discomfort. Her RIGHT knee shows no effusion. Range of motion 0-135. Slight tenderness medial greater than lateral with no instability. LEFT knee slight effusion. She has a slight varus deformity. Range of motion 5-125. There is marked crepitus on range of motion. She is tender medial greater than  lateral with no instability. Pulses sensation and motor are intact both lower extremities. Gait pattern is antalgic on the LEFT.  Radiographs-AP and lateral both knees show that she has bone-on-bone arthritis medial and patellofemoral in the LEFT knee.   Assessment / Plan 1. Osteoarthritis of left knee joint  Goals Patient Instructions Surgical Plans: Left Total Knee Replacement Disposition: Home, [facility] PCP: Dr. Westley Chandler Cards: Dr. Donnetta Hutching IV TXA Anesthesia Issues: None Patient was instructed on what medications to stop prior to surgery. - Follow up visit in 2 weeks with Dr. Wynelle Link - Begin physical therapy following surgery - Pre-operative lab work as pre Pre-Surgical Testing - Prescriptions will be provided in hospital at time of discharge   Anticipated LOS equal to or greater than 2 midnights due to - Age 80 and older with one or more of the following:  - Obesity  - Expected need for hospital services (PT, OT, Nursing) required for safe  discharge  - Anticipated need for postoperative skilled nursing care or inpatient rehab  - Active co-morbidities: HTN, Valvular Heart Disease   Return to Office Gaynelle Arabian, MD for [facility] on 02/03/2018 at 01:30 PM  Encounter signed-off by Mickel Crow, PA-C

## 2018-01-02 ENCOUNTER — Ambulatory Visit: Payer: Self-pay | Admitting: Orthopedic Surgery

## 2018-01-02 NOTE — H&P (Signed)
ISYSS, ESPINAL [phone], F)  DOB 08-05-1941   Chief Complaint Left Knee Pain H&P left total knee 01-19-2018  Patient's Care Team Primary Care Provider: Ival Bible MD: [address], [place], [place] [address], Ph [phone], Fax [phone] NPI: [id number] Cardiologist: Kathlen Brunswick: [address], [place], [place] [address], Ph [phone], Fax [phone] NPI: [id number] Patient's Pharmacies [place] [address] [facility]): [place], [place] [address], Ph [phone], Fax [phone]   Vitals Ht: 5 ft 5 in  Wt: 130 lbs BMI: 21.6  BP: 128/64   Allergies Reviewed Allergies NKDA  Medications Reviewed Medications celecoxib 200 mg capsule TK 1 C PO D 10/17/17   filled PRESCRIPTION SOLUTIONS hydroCHLOROthiazide 25 mg tablet TK 1/2 T PO BID 07/26/17   filled surescripts lisinopril 20 mg tablet TK 1/2 TO 1 T PO D FOR BP. DISCONTINUE THE MICARDIS. 12/25/17   filled PRESCRIPTION SOLUTIONS pravastatin 40 mg tablet 12/09/17   filled surescripts warfarin 7.5 mg tablet TK 1 T PO D EXCEPT 1/2 T ON TUESDAY AND THURSDAY OR AS DIRECTED 10/17/17   filled PRESCRIPTION SOLUTIONS   Problems Reviewed Problems No known problems Osteoarthritis of left knee joint - Onset: 11/08/2017   Family History Reviewed Family History   Social History Reviewed Social History Smoking Status: Former smoker Non-smoker Tobacco-years of use: 5 Chewing tobacco: none Alcohol intake: Occasional Hand Dominance: Right Work related injury?: N Advance directive: N Freight forwarder of Attorney: N   Surgical History Reviewed Surgical History Mechanical prosthetic aortic valve replacement - [facility] - 1980 Colonoscopy - 09/17/2007 Appendectomy - 09/17/2003 Hysterectomy - 09/17/2003 Cholecystectomy - 09/16/1993 Tubal ligation - 09/16/1966   Past Medical History Reviewed Past Medical History High Cholesterol: Y Hypertension: Y Irregular Heartbeat:  Y Osteoporosis: Y Previous Cortisone Injection(s): Y Weight loss: Y Notes: Rheumatic Fever, UTI,  Skin Cancer,  Irrgeular Heart Beat,  Osteoporosis,  History of Compression Fractures (3)   HPI The patient is here today for a pre-operative History and Physical. They are scheduled for left total knee replacement on 01-19-2018 with Dr. Wynelle Link at [facility]. Mrs Hilley has been seen for ongoing knee pain. Patient reports weakness, swelling, and stiffness. She reports that the left knee has been aching, sharp pain, and worsening with time. She reports severe ("when I have pain it is bad, as long as I sit still I don't have any pain").  She has had problems with the LEFT knee for many years now. It has gotten progressively worse in the past year. She is a stage now with the knee is hurting with all activities. If she has a busy day it will also hurt her at night. She has had injections which are no longer beneficial. Exercise does not help anymore. She is at a stage she is ready to get this knee fixed as it is having a very negative effect on her life. Radiographs-AP and lateral both knees show that she has bone-on-bone arthritis medial and patellofemoral in the LEFT knee. Patient has significant pain and dysfunction in their knee. They have had injections and failed nonoperative management. At this point the pain and dysfunction have essentially taken over their life. The most predictable means of improving pain and function is total knee arthroplasty. She is ready to proceed with surgery at this time.  ROS CONSTITUTIONAL: WEIGHT LOSS, but no Fever, no Weight Gain  HEAD, EARS, EYES, NOSE, AND THROAT: no Dry Eyes, no  Eye Irritation, no Vision Changes, no Sore Throat  CARDIOVASCULAR: no Chest Pain / Angina, no Palpitations, no Poor Circulation, no Leg Swelling  RESPIRATORY: no Cough, no Shortness of Breath, no COPD, no Asthma  GASTROINTESTINAL: no Nausea / Vomiting, no Black Stools / Blood  in Stool, no Diarrhea, no Vomiting Blood  GENITOURINARY: no Blood in Urine, no Difficulty Urinating  MUSCULOSKELETAL: JOINT PAIN, JOINT SWELLING, LOST 2 OR MORE INCHES IN HEIGHT, but no Rheumatoid Arthritis  SKIN: no Rash, no Varicose Veins  NEUROLOGIC: no Numbness, no Seizures, no Dizziness, no Problems with Balance  PSYCHIATRIC: no Anxiety, no Depression  ENDOCRINE: no Heat Intolerance  BLOOD / LYMPH: no Anemia, no Bleed Easily, no Bruise Easily, no Swollen Glands  Physical Exam Patient is a 77 year old female.  General Mental Status - Alert, cooperative and good historian. General Appearance - pleasant, Not in acute distress. Orientation - Oriented X3. Build & Nutrition - Well nourished and Well developed.  Head and Neck Head - normocephalic, atraumatic . Neck Global Assessment - supple, no bruit auscultated on the right, no bruit auscultated on the left.  Eye Pupil - Bilateral - PERR Motion - Bilateral - EOMI.  Chest and Lung Exam Auscultation Breath sounds - clear at anterior chest wall and clear at posterior chest wall. Adventitious sounds - No Adventitious sounds.  Cardiovascular Auscultation Rhythm - Regular rate and rhythm. Heart Sounds - S1 WNL and S2 WNL. Murmurs & Other Heart Sounds - Auscultation of the heart reveals - No Murmurs.  Abdomen Palpation/Percussion Tenderness - Abdomen is non-tender to palpation. Abdomen is soft. Auscultation Auscultation of the abdomen reveals - Bowel sounds normal.  Genitourinary Note: Not done, not pertinent to present illness  Musculoskeletal She has normal range of motion of both hips without discomfort. Her RIGHT knee shows no effusion. Range of motion 0-135. Slight tenderness medial greater than lateral with no instability. LEFT knee slight effusion. She has a slight varus deformity. Range of motion 5-125. There is marked crepitus on range of motion. She is tender medial greater than lateral with no instability.  Pulses sensation and motor are intact both lower extremities. Gait pattern is antalgic on the LEFT.  Radiographs-AP and lateral both knees show that she has bone-on-bone arthritis medial and patellofemoral in the LEFT knee.   Assessment / Plan 1. Osteoarthritis of left knee joint M17.12: Unilateral primary osteoarthritis, left knee  Goals Patient Instructions Surgical Plans: Left Total Knee Replacement Disposition: Home, [facility] PCP: Dr. Westley Chandler Cards: Dr. Donnetta Hutching IV TXA Anesthesia Issues: None Patient was instructed on what medications to stop prior to surgery. - Follow up visit in 2 weeks with Dr. Wynelle Link - Begin physical therapy following surgery - Pre-operative lab work as pre Pre-Surgical Testing - Prescriptions will be provided in hospital at time of discharge   Anticipated LOS equal to or greater than 2 midnights due to - Age 28 and older with one or more of the following:  - Obesity  - Expected need for hospital services (PT, OT, Nursing) required for safe  discharge  - Anticipated need for postoperative skilled nursing care or inpatient rehab  - Active co-morbidities:  High Cholesterol: Y Hypertension: Y Irregular Heartbeat: Y Osteoporosis: Y Previous Cortisone Injection(s): Y Weight loss: Y Notes: Rheumatic Fever, UTI,  Skin Cancer,  Irrgeular Heart Beat,  Osteoporosis,  History of Compression Fractures (3)   Return to Office Gaynelle Arabian, MD for [facility] on 02/03/2018 at 01:30 PM  Encounter  signed-off by Mickel Crow, PA-C

## 2018-01-02 NOTE — H&P (View-Only) (Signed)
JHERI, MITTER [phone], F)  DOB [birth year]   Chief Complaint Left Knee Pain H&P left total knee 01-19-2018  Patient's Care Team Primary Care Provider: Ival Bible MD: [address], [place], [place] [address], Ph [phone], Fax [phone] NPI: [id number] Cardiologist: Kathlen Brunswick: [address], [place], [place] [address], Ph [phone], Fax [phone] NPI: [id number] Patient's Pharmacies [place] [address] [facility]): [place], [place] [address], Ph [phone], Fax [phone]   Vitals Ht: 5 ft 5 in  Wt: 130 lbs BMI: 21.6  BP: 128/64   Allergies Reviewed Allergies NKDA  Medications Reviewed Medications celecoxib 200 mg capsule TK 1 C PO D 10/17/17   filled PRESCRIPTION SOLUTIONS hydroCHLOROthiazide 25 mg tablet TK 1/2 T PO BID 07/26/17   filled surescripts lisinopril 20 mg tablet TK 1/2 TO 1 T PO D FOR BP. DISCONTINUE THE MICARDIS. 12/25/17   filled PRESCRIPTION SOLUTIONS pravastatin 40 mg tablet 12/09/17   filled surescripts warfarin 7.5 mg tablet TK 1 T PO D EXCEPT 1/2 T ON TUESDAY AND THURSDAY OR AS DIRECTED 10/17/17   filled PRESCRIPTION SOLUTIONS   Problems Reviewed Problems No known problems Osteoarthritis of left knee joint - Onset: 11/08/2017   Family History Reviewed Family History   Social History Reviewed Social History Smoking Status: Former smoker Non-smoker Tobacco-years of use: 5 Chewing tobacco: none Alcohol intake: Occasional Hand Dominance: Right Work related injury?: N Advance directive: N Freight forwarder of Attorney: N   Surgical History Reviewed Surgical History Mechanical prosthetic aortic valve replacement - [facility] - 1980 Colonoscopy - 09/17/2007 Appendectomy - 09/17/2003 Hysterectomy - 09/17/2003 Cholecystectomy - 09/16/1993 Tubal ligation - 09/16/1966   Past Medical History Reviewed Past Medical History High Cholesterol: Y Hypertension: Y Irregular Heartbeat:  Y Osteoporosis: Y Previous Cortisone Injection(s): Y Weight loss: Y Notes: Rheumatic Fever, UTI,  Skin Cancer,  Irrgeular Heart Beat,  Osteoporosis,  History of Compression Fractures (3)   HPI The patient is here today for a pre-operative History and Physical. They are scheduled for left total knee replacement on 01-19-2018 with Dr. Wynelle Link at [facility]. Mrs Shackett has been seen for ongoing knee pain. Patient reports weakness, swelling, and stiffness. She reports that the left knee has been aching, sharp pain, and worsening with time. She reports severe ("when I have pain it is bad, as long as I sit still I don't have any pain").  She has had problems with the LEFT knee for many years now. It has gotten progressively worse in the past year. She is a stage now with the knee is hurting with all activities. If she has a busy day it will also hurt her at night. She has had injections which are no longer beneficial. Exercise does not help anymore. She is at a stage she is ready to get this knee fixed as it is having a very negative effect on her life. Radiographs-AP and lateral both knees show that she has bone-on-bone arthritis medial and patellofemoral in the LEFT knee. Patient has significant pain and dysfunction in their knee. They have had injections and failed nonoperative management. At this point the pain and dysfunction have essentially taken over their life. The most predictable means of improving pain and function is total knee arthroplasty. She is ready to proceed with surgery at this time.  ROS CONSTITUTIONAL: WEIGHT LOSS, but no Fever, no Weight Gain  HEAD, EARS, EYES, NOSE, AND THROAT: no Dry Eyes, no  Eye Irritation, no Vision Changes, no Sore Throat  CARDIOVASCULAR: no Chest Pain / Angina, no Palpitations, no Poor Circulation, no Leg Swelling  RESPIRATORY: no Cough, no Shortness of Breath, no COPD, no Asthma  GASTROINTESTINAL: no Nausea / Vomiting, no Black Stools / Blood  in Stool, no Diarrhea, no Vomiting Blood  GENITOURINARY: no Blood in Urine, no Difficulty Urinating  MUSCULOSKELETAL: JOINT PAIN, JOINT SWELLING, LOST 2 OR MORE INCHES IN HEIGHT, but no Rheumatoid Arthritis  SKIN: no Rash, no Varicose Veins  NEUROLOGIC: no Numbness, no Seizures, no Dizziness, no Problems with Balance  PSYCHIATRIC: no Anxiety, no Depression  ENDOCRINE: no Heat Intolerance  BLOOD / LYMPH: no Anemia, no Bleed Easily, no Bruise Easily, no Swollen Glands  Physical Exam Patient is a 77 year old female.  General Mental Status - Alert, cooperative and good historian. General Appearance - pleasant, Not in acute distress. Orientation - Oriented X3. Build & Nutrition - Well nourished and Well developed.  Head and Neck Head - normocephalic, atraumatic . Neck Global Assessment - supple, no bruit auscultated on the right, no bruit auscultated on the left.  Eye Pupil - Bilateral - PERR Motion - Bilateral - EOMI.  Chest and Lung Exam Auscultation Breath sounds - clear at anterior chest wall and clear at posterior chest wall. Adventitious sounds - No Adventitious sounds.  Cardiovascular Auscultation Rhythm - Regular rate and rhythm. Heart Sounds - S1 WNL and S2 WNL. Murmurs & Other Heart Sounds - Auscultation of the heart reveals - No Murmurs.  Abdomen Palpation/Percussion Tenderness - Abdomen is non-tender to palpation. Abdomen is soft. Auscultation Auscultation of the abdomen reveals - Bowel sounds normal.  Genitourinary Note: Not done, not pertinent to present illness  Musculoskeletal She has normal range of motion of both hips without discomfort. Her RIGHT knee shows no effusion. Range of motion 0-135. Slight tenderness medial greater than lateral with no instability. LEFT knee slight effusion. She has a slight varus deformity. Range of motion 5-125. There is marked crepitus on range of motion. She is tender medial greater than lateral with no instability.  Pulses sensation and motor are intact both lower extremities. Gait pattern is antalgic on the LEFT.  Radiographs-AP and lateral both knees show that she has bone-on-bone arthritis medial and patellofemoral in the LEFT knee.   Assessment / Plan 1. Osteoarthritis of left knee joint M17.12: Unilateral primary osteoarthritis, left knee  Goals Patient Instructions Surgical Plans: Left Total Knee Replacement Disposition: [facility], [facility] PCP: Dr. Westley Chandler Cards: Dr. Donnetta Hutching IV TXA Anesthesia Issues: None Patient was instructed on what medications to stop prior to surgery. - Follow up visit in 2 weeks with Dr. Wynelle Link - Begin physical therapy following surgery - Pre-operative lab work as pre Pre-Surgical Testing - Prescriptions will be provided in hospital at time of discharge   Anticipated LOS equal to or greater than 2 midnights due to - Age 78 and older with one or more of the following:  - Obesity  - Expected need for hospital services (PT, OT, Nursing) required for safe  discharge  - Anticipated need for postoperative skilled nursing care or inpatient rehab  - Active co-morbidities:  High Cholesterol: Y Hypertension: Y Irregular Heartbeat: Y Osteoporosis: Y Previous Cortisone Injection(s): Y Weight loss: Y Notes: Rheumatic Fever, UTI,  Skin Cancer,  Irrgeular Heart Beat,  Osteoporosis,  History of Compression Fractures (3)   Return to Office Gaynelle Arabian, MD for [facility] on 02/03/2018 at 01:30 PM  Encounter  signed-off by Mickel Crow, PA-C

## 2018-01-12 ENCOUNTER — Encounter (HOSPITAL_COMMUNITY): Payer: Self-pay

## 2018-01-12 NOTE — Pre-Procedure Instructions (Signed)
Cardiac clearance Dr. Donnetta Hutching in patient's hard chart.

## 2018-01-12 NOTE — Patient Instructions (Signed)
Your procedure is scheduled on: Monday, Jan 19, 2018   Surgery Time:  12:40PM- 1:30PM   Report to [place]  Entrance    Report to admitting at 10:10 AM   Call this number if you have problems the morning of surgery [phone]   Do not eat food:After Midnight.   Do NOT smoke after Midnight   May have liquids until 6:30AM morning of surgery      CLEAR LIQUID DIET   Foods Allowed                                                                     Foods Excluded  Coffee and tea, regular and decaf                             liquids that you cannot  Plain Jell-O in any flavor                                             see through such as: Fruit ices (not with fruit pulp)                                     milk, soups, orange juice  Iced Popsicles                                    All solid food Carbonated beverages, regular and diet                                    Cranberry, grape and apple juices Sports drinks like Gatorade Lightly seasoned clear broth or consume(fat free) Sugar, honey syrup  Sample Menu Breakfast                                Lunch                                     Supper Cranberry juice                    Beef broth                            Chicken broth Jell-O                                     Grape juice                           Apple juice Coffee or tea  Jell-O                                      Popsicle                                                Coffee or tea                        Coffee or tea   Take these medicines the morning of surgery with A SIP OF WATER: None                               You may not have any metal on your body including hair pins, jewelry, and body piercings             Do not wear make-up, lotions, powders, perfumes/cologne, or deodorant             Do not wear nail polish.  Do not shave  48 hours prior to surgery.                Do not bring valuables to the  hospital. [place].   Contacts, dentures or bridgework may not be worn into surgery.   Leave suitcase in the car. After surgery it may be brought to your room.   Special Instructions: Bring a copy of your healthcare power of attorney and living will documents         the day of surgery if you haven't scanned them in before.              Please read over the following fact sheets you were given:  [facility] - Preparing for Surgery Before surgery, you can play an important role.  Because skin is not sterile, your skin needs to be as free of germs as possible.  You can reduce the number of germs on your skin by washing with CHG (chlorahexidine gluconate) soap before surgery.  CHG is an antiseptic cleaner which kills germs and bonds with the skin to continue killing germs even after washing. Please DO NOT use if you have an allergy to CHG or antibacterial soaps.  If your skin becomes reddened/irritated stop using the CHG and inform your nurse when you arrive at Short Stay. Do not shave (including legs and underarms) for at least 48 hours prior to the first CHG shower.  You may shave your face/neck.  Please follow these instructions carefully:  1.  Shower with CHG Soap the night before surgery and the  morning of surgery.  2.  If you choose to wash your hair, wash your hair first as usual with your normal  shampoo.  3.  After you shampoo, rinse your hair and body thoroughly to remove the shampoo.                             4.  Use CHG as you would any other liquid soap.  You can apply chg directly to the skin and wash.  Gently with a  scrungie or clean washcloth.  5.  Apply the CHG Soap to your body ONLY FROM THE NECK DOWN.   Do   not use on face/ open                           Wound or open sores. Avoid contact with eyes, ears mouth and   genitals (private parts).                       Wash face,  Genitals (private parts) with your normal soap.              6.  Wash thoroughly, paying special attention to the area where your    surgery  will be performed.  7.  Thoroughly rinse your body with warm water from the neck down.  8.  DO NOT shower/wash with your normal soap after using and rinsing off the CHG Soap.                9.  Pat yourself dry with a clean towel.            10.  Wear clean pajamas.            11.  Place clean sheets on your bed the night of your first shower and do not  sleep with pets. Day of Surgery : Do not apply any lotions/deodorants the morning of surgery.  Please wear clean clothes to the hospital/surgery center.  FAILURE TO FOLLOW THESE INSTRUCTIONS MAY RESULT IN THE CANCELLATION OF YOUR SURGERY  PATIENT SIGNATURE_________________________________  NURSE SIGNATURE__________________________________  ________________________________________________________________________   Marissa Brown  An incentive spirometer is a tool that can help keep your lungs clear and active. This tool measures how well you are filling your lungs with each breath. Taking long deep breaths may help reverse or decrease the chance of developing breathing (pulmonary) problems (especially infection) following:  A long period of time when you are unable to move or be active. BEFORE THE PROCEDURE   If the spirometer includes an indicator to show your best effort, your nurse or respiratory therapist will set it to a desired goal.  If possible, sit up straight or lean slightly forward. Try not to slouch.  Hold the incentive spirometer in an upright position. INSTRUCTIONS FOR USE  1. Sit on the edge of your bed if possible, or sit up as far as you can in bed or on a chair. 2. Hold the incentive spirometer in an upright position. 3. Breathe out normally. 4. Place the mouthpiece in your mouth and seal your lips tightly around it. 5. Breathe in slowly and as deeply as possible, raising the piston or the ball toward the top of the  column. 6. Hold your breath for 3-5 seconds or for as long as possible. Allow the piston or ball to fall to the bottom of the column. 7. Remove the mouthpiece from your mouth and breathe out normally. 8. Rest for a few seconds and repeat Steps 1 through 7 at least 10 times every 1-2 hours when you are awake. Take your time and take a few normal breaths between deep breaths. 9. The spirometer may include an indicator to show your best effort. Use the indicator as a goal to work toward during each repetition. 10. After each set of 10 deep breaths, practice coughing to be sure your lungs are clear. If you have an incision (the cut made  at the time of surgery), support your incision when coughing by placing a pillow or rolled up towels firmly against it. Once you are able to get out of bed, walk around indoors and cough well. You may stop using the incentive spirometer when instructed by your caregiver.  RISKS AND COMPLICATIONS  Take your time so you do not get dizzy or light-headed.  If you are in pain, you may need to take or ask for pain medication before doing incentive spirometry. It is harder to take a deep breath if you are having pain. AFTER USE  Rest and breathe slowly and easily.  It can be helpful to keep track of a log of your progress. Your caregiver can provide you with a simple table to help with this. If you are using the spirometer at home, follow these instructions: [place] IF:   You are having difficultly using the spirometer.  You have trouble using the spirometer as often as instructed.  Your pain medication is not giving enough relief while using the spirometer.  You develop fever of 100.5 F (38.1 C) or higher. SEEK IMMEDIATE MEDICAL CARE IF:   You cough up bloody sputum that had not been present before.  You develop fever of 102 F (38.9 C) or greater.  You develop worsening pain at or near the incision site. MAKE SURE YOU:   Understand these  instructions.  Will watch your condition.  Will get help right away if you are not doing well or get worse. Document Released: 01/13/2007 Document Revised: 11/25/2011 Document Reviewed: 03/16/2007 ExitCare Patient Information 2014 ExitCare, [place].   ________________________________________________________________________  WHAT IS A BLOOD TRANSFUSION? Blood Transfusion Information  A transfusion is the replacement of blood or some of its parts. Blood is made up of multiple cells which provide different functions.  Red blood cells carry oxygen and are used for blood loss replacement.  White blood cells fight against infection.  Platelets control bleeding.  Plasma helps clot blood.  Other blood products are available for specialized needs, such as hemophilia or other clotting disorders. BEFORE THE TRANSFUSION  Who gives blood for transfusions?   Healthy volunteers who are fully evaluated to make sure their blood is safe. This is blood bank blood. Transfusion therapy is the safest it has ever been in the practice of medicine. Before blood is taken from a donor, a complete history is taken to make sure that person has no history of diseases nor engages in risky social behavior (examples are intravenous drug use or sexual activity with multiple partners). The donor's travel history is screened to minimize risk of transmitting infections, such as malaria. The donated blood is tested for signs of infectious diseases, such as HIV and hepatitis. The blood is then tested to be sure it is compatible with you in order to minimize the chance of a transfusion reaction. If you or a relative donates blood, this is often done in anticipation of surgery and is not appropriate for emergency situations. It takes many days to process the donated blood. RISKS AND COMPLICATIONS Although transfusion therapy is very safe and saves many lives, the main dangers of transfusion include:   Getting an infectious  disease.  Developing a transfusion reaction. This is an allergic reaction to something in the blood you were given. Every precaution is taken to prevent this. The decision to have a blood transfusion has been considered carefully by your caregiver before blood is given. Blood is not given unless the benefits outweigh  the risks. AFTER THE TRANSFUSION  Right after receiving a blood transfusion, you will usually feel much better and more energetic. This is especially true if your red blood cells have gotten low (anemic). The transfusion raises the level of the red blood cells which carry oxygen, and this usually causes an energy increase.  The nurse administering the transfusion will monitor you carefully for complications. HOME CARE INSTRUCTIONS  No special instructions are needed after a transfusion. You may find your energy is better. Speak with your caregiver about any limitations on activity for underlying diseases you may have. SEEK MEDICAL CARE IF:   Your condition is not improving after your transfusion.  You develop redness or irritation at the intravenous (IV) site. SEEK IMMEDIATE MEDICAL CARE IF:  Any of the following symptoms occur over the next 12 hours:  Shaking chills.  You have a temperature by mouth above 102 F (38.9 C), not controlled by medicine.  Chest, back, or muscle pain.  People around you feel you are not acting correctly or are confused.  Shortness of breath or difficulty breathing.  Dizziness and fainting.  You get a rash or develop hives.  You have a decrease in urine output.  Your urine turns a dark color or changes to pink, red, or brown. Any of the following symptoms occur over the next 10 days:  You have a temperature by mouth above 102 F (38.9 C), not controlled by medicine.  Shortness of breath.  Weakness after normal activity.  The white part of the eye turns yellow (jaundice).  You have a decrease in the amount of urine or are  urinating less often.  Your urine turns a dark color or changes to pink, red, or brown. Document Released: 08/30/2000 Document Revised: 11/25/2011 Document Reviewed: 04/18/2008 [facility] Patient Information 2014 [place], [place].  _______________________________________________________________________

## 2018-01-14 ENCOUNTER — Encounter (HOSPITAL_COMMUNITY)
Admission: RE | Admit: 2018-01-14 | Discharge: 2018-01-14 | Disposition: A | Discharge status: Home / Self Care | Payer: Medicare Other | Source: Ambulatory Visit | Attending: Orthopedic Surgery | Admitting: Orthopedic Surgery

## 2018-01-14 ENCOUNTER — Other Ambulatory Visit: Payer: Self-pay

## 2018-01-14 ENCOUNTER — Encounter (HOSPITAL_COMMUNITY): Payer: Self-pay

## 2018-01-14 DIAGNOSIS — I4510 Unspecified right bundle-branch block: Secondary | ICD-10-CM | POA: Diagnosis not present

## 2018-01-14 DIAGNOSIS — Z01810 Encounter for preprocedural cardiovascular examination: Secondary | ICD-10-CM | POA: Insufficient documentation

## 2018-01-14 DIAGNOSIS — Z01812 Encounter for preprocedural laboratory examination: Secondary | ICD-10-CM | POA: Diagnosis present

## 2018-01-14 DIAGNOSIS — M1712 Unilateral primary osteoarthritis, left knee: Secondary | ICD-10-CM | POA: Insufficient documentation

## 2018-01-14 HISTORY — DX: Unspecified malignant neoplasm of skin, unspecified: C44.90

## 2018-01-14 HISTORY — DX: Other specified disorders of bone density and structure, unspecified site: M85.80

## 2018-01-14 HISTORY — DX: Other chronic pain: G89.29

## 2018-01-14 HISTORY — DX: Personal history of other diseases of the circulatory system: Z86.79

## 2018-01-14 HISTORY — DX: Dorsalgia, unspecified: M54.9

## 2018-01-14 HISTORY — DX: Personal history of other infectious and parasitic diseases: Z86.19

## 2018-01-14 LAB — CBC
HCT: 42.5 % (ref 36.0–46.0)
HEMOGLOBIN: 14.1 g/dL (ref 12.0–15.0)
MCH: 32.6 pg (ref 26.0–34.0)
MCHC: 33.2 g/dL (ref 30.0–36.0)
MCV: 98.4 fL (ref 78.0–100.0)
Platelets: 215 10*3/uL (ref 150–400)
RBC: 4.32 MIL/uL (ref 3.87–5.11)
RDW: 13.4 % (ref 11.5–15.5)
WBC: 5.6 10*3/uL (ref 4.0–10.5)

## 2018-01-14 LAB — COMPREHENSIVE METABOLIC PANEL
ALK PHOS: 39 U/L (ref 38–126)
ALT: 17 U/L (ref 14–54)
ANION GAP: 10 (ref 5–15)
AST: 28 U/L (ref 15–41)
Albumin: 4.4 g/dL (ref 3.5–5.0)
BUN: 19 mg/dL (ref 6–20)
CALCIUM: 9.6 mg/dL (ref 8.9–10.3)
CO2: 25 mmol/L (ref 22–32)
CREATININE: 0.74 mg/dL (ref 0.44–1.00)
Chloride: 104 mmol/L (ref 101–111)
Glucose, Bld: 87 mg/dL (ref 65–99)
Potassium: 4.2 mmol/L (ref 3.5–5.1)
SODIUM: 139 mmol/L (ref 135–145)
TOTAL PROTEIN: 7.6 g/dL (ref 6.5–8.1)
Total Bilirubin: 0.7 mg/dL (ref 0.3–1.2)

## 2018-01-14 LAB — ABO/RH: ABO/RH(D): A NEG

## 2018-01-14 LAB — APTT: aPTT: 40 seconds — ABNORMAL HIGH (ref 24–36)

## 2018-01-14 LAB — PROTIME-INR
INR: 2.38
PROTHROMBIN TIME: 25.8 s — AB (ref 11.4–15.2)

## 2018-01-14 LAB — SURGICAL PCR SCREEN
MRSA, PCR: NEGATIVE
Staphylococcus aureus: NEGATIVE

## 2018-01-14 NOTE — Pre-Procedure Instructions (Signed)
PT, PTT results 01/14/2018 faxed to Dr. Wynelle Link via epic. Dr. Nyoka Cowden made aware via phone, no new orders received at this time.

## 2018-01-14 NOTE — Pre-Procedure Instructions (Signed)
ECHO 04/01/2017 in patient's hard chart

## 2018-01-14 NOTE — Pre-Procedure Instructions (Signed)
Dr. Gennie Alma made aware of Aortic Valve replacement, no anesthesia consult required at this time.

## 2018-01-15 NOTE — Pre-Procedure Instructions (Signed)
Informed Dr. Ola Spurr that Bennye Alm from coumadin clinic, John from [facility] and Dr. Donnetta Hutching believes no bridge is needed for Ms. Nordahl to proceed with her surgery.  Dr. Ola Spurr is ok with that.

## 2018-01-15 NOTE — Pre-Procedure Instructions (Signed)
Dr. Deatra Canter reviewed EKG, ECHO, and medical history.  I informed Dr. Ola Spurr Marissa Brown was told not to do a Lovenox bridge prior to surgery.  Dr. Ola Spurr wanted Dr. Donnetta Hutching to be made aware that patient was not on a bridge and he is very concerned for Marissa Brown developing clots with her mechanical aortic valve.  I informed Dawn at Dr. Deatra Robinson office about Dr. Blane Ohara concerns and asked that she notify Dr. Donnetta Hutching that Marissa Brown is not on the Lovenox bridge as stated on his clearance form.  I also called and left a message with Ms. Schnake to make her aware she needs to follow up about the lovenox bridge prior to surgery.

## 2018-01-18 MED ORDER — TRANEXAMIC ACID 1000 MG/10ML IV SOLN
1000.0000 mg | INTRAVENOUS | Status: AC
  Administered 2018-01-19: 1000 mg via INTRAVENOUS
  Filled 2018-01-18: qty 1100

## 2018-01-18 MED ORDER — BUPIVACAINE LIPOSOME 1.3 % IJ SUSP
20.0000 mL | Freq: Once | INTRAMUSCULAR | Status: DC
  Filled 2018-01-18: qty 20

## 2018-01-19 ENCOUNTER — Inpatient Hospital Stay (HOSPITAL_COMMUNITY): Payer: Medicare Other | Admitting: Anesthesiology

## 2018-01-19 ENCOUNTER — Encounter (HOSPITAL_COMMUNITY): Payer: Self-pay | Admitting: Emergency Medicine

## 2018-01-19 ENCOUNTER — Other Ambulatory Visit: Payer: Self-pay

## 2018-01-19 ENCOUNTER — Inpatient Hospital Stay (HOSPITAL_COMMUNITY)
Admission: RE | Admit: 2018-01-19 | Discharge: 2018-01-27 | DRG: 470 | Disposition: A | Discharge status: Skilled Nursing Facility | Payer: Medicare Other | Source: Ambulatory Visit | Attending: Internal Medicine | Admitting: Internal Medicine

## 2018-01-19 ENCOUNTER — Encounter (HOSPITAL_COMMUNITY)
Admission: RE | Disposition: A | Discharge status: Skilled Nursing Facility | Payer: Self-pay | Source: Ambulatory Visit | Attending: Internal Medicine

## 2018-01-19 DIAGNOSIS — N10 Acute tubulo-interstitial nephritis: Secondary | ICD-10-CM | POA: Diagnosis not present

## 2018-01-19 DIAGNOSIS — I10 Essential (primary) hypertension: Secondary | ICD-10-CM | POA: Diagnosis present

## 2018-01-19 DIAGNOSIS — Z85828 Personal history of other malignant neoplasm of skin: Secondary | ICD-10-CM | POA: Diagnosis not present

## 2018-01-19 DIAGNOSIS — Z952 Presence of prosthetic heart valve: Secondary | ICD-10-CM

## 2018-01-19 DIAGNOSIS — Z7901 Long term (current) use of anticoagulants: Secondary | ICD-10-CM | POA: Diagnosis not present

## 2018-01-19 DIAGNOSIS — M810 Age-related osteoporosis without current pathological fracture: Secondary | ICD-10-CM | POA: Diagnosis present

## 2018-01-19 DIAGNOSIS — M1710 Unilateral primary osteoarthritis, unspecified knee: Secondary | ICD-10-CM

## 2018-01-19 DIAGNOSIS — E7800 Pure hypercholesterolemia, unspecified: Secondary | ICD-10-CM | POA: Diagnosis present

## 2018-01-19 DIAGNOSIS — M179 Osteoarthritis of knee, unspecified: Secondary | ICD-10-CM

## 2018-01-19 DIAGNOSIS — D62 Acute posthemorrhagic anemia: Secondary | ICD-10-CM | POA: Diagnosis not present

## 2018-01-19 DIAGNOSIS — D689 Coagulation defect, unspecified: Secondary | ICD-10-CM | POA: Diagnosis not present

## 2018-01-19 DIAGNOSIS — Z79899 Other long term (current) drug therapy: Secondary | ICD-10-CM | POA: Diagnosis not present

## 2018-01-19 DIAGNOSIS — I951 Orthostatic hypotension: Secondary | ICD-10-CM | POA: Diagnosis not present

## 2018-01-19 DIAGNOSIS — D72829 Elevated white blood cell count, unspecified: Secondary | ICD-10-CM | POA: Diagnosis present

## 2018-01-19 DIAGNOSIS — M25552 Pain in left hip: Secondary | ICD-10-CM | POA: Diagnosis not present

## 2018-01-19 DIAGNOSIS — I4510 Unspecified right bundle-branch block: Secondary | ICD-10-CM | POA: Diagnosis present

## 2018-01-19 DIAGNOSIS — M5442 Lumbago with sciatica, left side: Secondary | ICD-10-CM | POA: Diagnosis not present

## 2018-01-19 DIAGNOSIS — M1712 Unilateral primary osteoarthritis, left knee: Secondary | ICD-10-CM | POA: Diagnosis present

## 2018-01-19 DIAGNOSIS — I493 Ventricular premature depolarization: Secondary | ICD-10-CM | POA: Diagnosis not present

## 2018-01-19 DIAGNOSIS — Z87891 Personal history of nicotine dependence: Secondary | ICD-10-CM

## 2018-01-19 DIAGNOSIS — I361 Nonrheumatic tricuspid (valve) insufficiency: Secondary | ICD-10-CM | POA: Diagnosis not present

## 2018-01-19 DIAGNOSIS — R55 Syncope and collapse: Secondary | ICD-10-CM | POA: Diagnosis not present

## 2018-01-19 HISTORY — PX: TOTAL KNEE ARTHROPLASTY: SHX125

## 2018-01-19 LAB — PROTIME-INR
INR: 1.03
PROTHROMBIN TIME: 13.4 s (ref 11.4–15.2)

## 2018-01-19 SURGERY — ARTHROPLASTY, KNEE, TOTAL
Anesthesia: Spinal | Site: Knee | Laterality: Left

## 2018-01-19 MED ORDER — RIVAROXABAN 10 MG PO TABS: 10.0000 mg | ORAL_TABLET | Freq: Every day | ORAL | Status: DC

## 2018-01-19 MED ORDER — METOCLOPRAMIDE HCL 5 MG/ML IJ SOLN
5.0000 mg | Freq: Three times a day (TID) | INTRAMUSCULAR | Status: DC | PRN
  Administered 2018-01-21: 10 mg via INTRAVENOUS
  Filled 2018-01-19: qty 2

## 2018-01-19 MED ORDER — METHOCARBAMOL 500 MG PO TABS
500.0000 mg | ORAL_TABLET | Freq: Four times a day (QID) | ORAL | Status: DC | PRN
  Administered 2018-01-20: 500 mg via ORAL
  Filled 2018-01-19: qty 1

## 2018-01-19 MED ORDER — 0.9 % SODIUM CHLORIDE (POUR BTL) OPTIME
TOPICAL | Status: DC | PRN
  Administered 2018-01-19: 1000 mL

## 2018-01-19 MED ORDER — GABAPENTIN 300 MG PO CAPS
300.0000 mg | ORAL_CAPSULE | Freq: Once | ORAL | Status: AC
  Administered 2018-01-19: 300 mg via ORAL
  Filled 2018-01-19: qty 1

## 2018-01-19 MED ORDER — STERILE WATER FOR IRRIGATION IR SOLN
Status: DC | PRN
  Administered 2018-01-19: 2000 mL

## 2018-01-19 MED ORDER — WARFARIN - PHARMACIST DOSING INPATIENT
Freq: Every day | Status: DC
  Filled 2018-01-19: qty 1

## 2018-01-19 MED ORDER — METOCLOPRAMIDE HCL 5 MG PO TABS: 5.0000 mg | ORAL_TABLET | Freq: Three times a day (TID) | ORAL | Status: DC | PRN

## 2018-01-19 MED ORDER — ONDANSETRON HCL 4 MG/2ML IJ SOLN
INTRAMUSCULAR | Status: AC
  Filled 2018-01-19: qty 2

## 2018-01-19 MED ORDER — DEXAMETHASONE SODIUM PHOSPHATE 10 MG/ML IJ SOLN
INTRAMUSCULAR | Status: AC
  Filled 2018-01-19: qty 1

## 2018-01-19 MED ORDER — ONDANSETRON HCL 4 MG/2ML IJ SOLN
4.0000 mg | Freq: Four times a day (QID) | INTRAMUSCULAR | Status: DC | PRN
  Administered 2018-01-22: 4 mg via INTRAVENOUS
  Filled 2018-01-19: qty 2

## 2018-01-19 MED ORDER — ENOXAPARIN SODIUM 40 MG/0.4ML ~~LOC~~ SOLN
40.0000 mg | SUBCUTANEOUS | Status: DC
  Administered 2018-01-20 – 2018-01-22 (×3): 40 mg via SUBCUTANEOUS
  Filled 2018-01-19 (×3): qty 0.4

## 2018-01-19 MED ORDER — BISACODYL 10 MG RE SUPP
10.0000 mg | Freq: Every day | RECTAL | Status: DC | PRN
  Administered 2018-01-24: 10 mg via RECTAL
  Filled 2018-01-19: qty 1

## 2018-01-19 MED ORDER — ACETAMINOPHEN 325 MG PO TABS
325.0000 mg | ORAL_TABLET | Freq: Four times a day (QID) | ORAL | Status: AC | PRN
  Administered 2018-01-20 – 2018-01-22 (×4): 650 mg via ORAL
  Filled 2018-01-19 (×4): qty 2

## 2018-01-19 MED ORDER — WARFARIN SODIUM 5 MG PO TABS
7.5000 mg | ORAL_TABLET | Freq: Once | ORAL | Status: AC
  Administered 2018-01-19: 7.5 mg via ORAL
  Filled 2018-01-19: qty 1

## 2018-01-19 MED ORDER — ROPIVACAINE HCL 7.5 MG/ML IJ SOLN
INTRAMUSCULAR | Status: DC | PRN
  Administered 2018-01-19: 20 mL via PERINEURAL

## 2018-01-19 MED ORDER — FENTANYL CITRATE (PF) 100 MCG/2ML IJ SOLN: 25.0000 ug | INTRAMUSCULAR | Status: DC | PRN

## 2018-01-19 MED ORDER — PHENOL 1.4 % MT LIQD: 1.0000 | OROMUCOSAL | Status: DC | PRN

## 2018-01-19 MED ORDER — ONDANSETRON HCL 4 MG/2ML IJ SOLN
INTRAMUSCULAR | Status: DC | PRN
  Administered 2018-01-19: 4 mg via INTRAVENOUS

## 2018-01-19 MED ORDER — EPHEDRINE SULFATE-NACL 50-0.9 MG/10ML-% IV SOSY
PREFILLED_SYRINGE | INTRAVENOUS | Status: DC | PRN
  Administered 2018-01-19: 5 mg via INTRAVENOUS

## 2018-01-19 MED ORDER — DIPHENHYDRAMINE HCL 12.5 MG/5ML PO ELIX: 12.5000 mg | ORAL_SOLUTION | ORAL | Status: DC | PRN

## 2018-01-19 MED ORDER — ONDANSETRON HCL 4 MG/2ML IJ SOLN: 4.0000 mg | Freq: Once | INTRAMUSCULAR | Status: DC | PRN

## 2018-01-19 MED ORDER — ENOXAPARIN SODIUM 40 MG/0.4ML ~~LOC~~ SOLN: 40.0000 mg | SUBCUTANEOUS | Status: DC

## 2018-01-19 MED ORDER — EPHEDRINE 5 MG/ML INJ
INTRAVENOUS | Status: AC
  Filled 2018-01-19: qty 10

## 2018-01-19 MED ORDER — OXYCODONE HCL 5 MG PO TABS
10.0000 mg | ORAL_TABLET | ORAL | Status: DC | PRN
  Administered 2018-01-20 (×2): 10 mg via ORAL

## 2018-01-19 MED ORDER — PROPOFOL 10 MG/ML IV BOLUS
INTRAVENOUS | Status: AC
  Filled 2018-01-19: qty 40

## 2018-01-19 MED ORDER — ONDANSETRON HCL 4 MG PO TABS: 4.0000 mg | ORAL_TABLET | Freq: Four times a day (QID) | ORAL | Status: DC | PRN

## 2018-01-19 MED ORDER — ACETAMINOPHEN 500 MG PO TABS
1000.0000 mg | ORAL_TABLET | Freq: Four times a day (QID) | ORAL | Status: AC
  Administered 2018-01-19 – 2018-01-20 (×4): 1000 mg via ORAL
  Filled 2018-01-19 (×4): qty 2

## 2018-01-19 MED ORDER — CEFAZOLIN SODIUM-DEXTROSE 2-4 GM/100ML-% IV SOLN
2.0000 g | INTRAVENOUS | Status: AC
  Administered 2018-01-19: 2 g via INTRAVENOUS
  Filled 2018-01-19: qty 100

## 2018-01-19 MED ORDER — OXYCODONE HCL 5 MG PO TABS
5.0000 mg | ORAL_TABLET | ORAL | Status: DC | PRN
  Administered 2018-01-19: 5 mg via ORAL
  Administered 2018-01-19: 10 mg via ORAL
  Administered 2018-01-19 – 2018-01-20 (×2): 5 mg via ORAL
  Administered 2018-01-21: 10 mg via ORAL
  Administered 2018-01-21 – 2018-01-22 (×3): 5 mg via ORAL
  Filled 2018-01-19 (×2): qty 1
  Filled 2018-01-19: qty 2
  Filled 2018-01-19: qty 1
  Filled 2018-01-19 (×4): qty 2
  Filled 2018-01-19 (×2): qty 1

## 2018-01-19 MED ORDER — LIDOCAINE HCL (CARDIAC) PF 100 MG/5ML IV SOSY
PREFILLED_SYRINGE | INTRAVENOUS | Status: DC | PRN
  Administered 2018-01-19: 60 mg via INTRAVENOUS

## 2018-01-19 MED ORDER — TRAMADOL HCL 50 MG PO TABS
50.0000 mg | ORAL_TABLET | Freq: Four times a day (QID) | ORAL | Status: DC | PRN
  Administered 2018-01-22: 100 mg via ORAL
  Filled 2018-01-19: qty 2

## 2018-01-19 MED ORDER — LACTATED RINGERS IV SOLN
INTRAVENOUS | Status: DC
  Administered 2018-01-19 (×2): via INTRAVENOUS

## 2018-01-19 MED ORDER — SODIUM CHLORIDE 0.9 % IJ SOLN
INTRAMUSCULAR | Status: AC
  Filled 2018-01-19: qty 10

## 2018-01-19 MED ORDER — SODIUM CHLORIDE 0.9 % IJ SOLN
INTRAMUSCULAR | Status: DC | PRN
  Administered 2018-01-19: 60 mL

## 2018-01-19 MED ORDER — METHOCARBAMOL 1000 MG/10ML IJ SOLN
500.0000 mg | Freq: Four times a day (QID) | INTRAVENOUS | Status: DC | PRN
  Filled 2018-01-19: qty 5

## 2018-01-19 MED ORDER — DOCUSATE SODIUM 100 MG PO CAPS
100.0000 mg | ORAL_CAPSULE | Freq: Two times a day (BID) | ORAL | Status: DC
  Administered 2018-01-19 – 2018-01-27 (×15): 100 mg via ORAL
  Filled 2018-01-19 (×15): qty 1

## 2018-01-19 MED ORDER — POLYETHYLENE GLYCOL 3350 17 G PO PACK
17.0000 g | PACK | Freq: Every day | ORAL | Status: DC | PRN
  Administered 2018-01-23 – 2018-01-24 (×2): 17 g via ORAL
  Filled 2018-01-19 (×2): qty 1

## 2018-01-19 MED ORDER — HYDROMORPHONE HCL 1 MG/ML IJ SOLN: 0.5000 mg | INTRAMUSCULAR | Status: DC | PRN

## 2018-01-19 MED ORDER — MENTHOL 3 MG MT LOZG
1.0000 | LOZENGE | OROMUCOSAL | Status: DC | PRN
  Filled 2018-01-19: qty 9

## 2018-01-19 MED ORDER — ACETAMINOPHEN 10 MG/ML IV SOLN
1000.0000 mg | Freq: Once | INTRAVENOUS | Status: AC
  Administered 2018-01-19: 1000 mg via INTRAVENOUS
  Filled 2018-01-19: qty 100

## 2018-01-19 MED ORDER — CEFAZOLIN SODIUM-DEXTROSE 2-4 GM/100ML-% IV SOLN
2.0000 g | Freq: Four times a day (QID) | INTRAVENOUS | Status: AC
  Administered 2018-01-19 – 2018-01-20 (×2): 2 g via INTRAVENOUS
  Filled 2018-01-19 (×2): qty 100

## 2018-01-19 MED ORDER — PROPOFOL 500 MG/50ML IV EMUL
INTRAVENOUS | Status: DC | PRN
  Administered 2018-01-19: 100 ug/kg/min via INTRAVENOUS

## 2018-01-19 MED ORDER — ENOXAPARIN SODIUM 40 MG/0.4ML ~~LOC~~ SOLN
40.0000 mg | SUBCUTANEOUS | Status: DC
Start: 2018-01-20 — End: 2018-01-19

## 2018-01-19 MED ORDER — DEXAMETHASONE SODIUM PHOSPHATE 10 MG/ML IJ SOLN
10.0000 mg | Freq: Once | INTRAMUSCULAR | Status: AC
  Administered 2018-01-19: 10 mg via INTRAVENOUS

## 2018-01-19 MED ORDER — BUPIVACAINE LIPOSOME 1.3 % IJ SUSP
INTRAMUSCULAR | Status: DC | PRN
  Administered 2018-01-19: 20 mL

## 2018-01-19 MED ORDER — TRANEXAMIC ACID 1000 MG/10ML IV SOLN
1000.0000 mg | Freq: Once | INTRAVENOUS | Status: AC
  Administered 2018-01-19: 1000 mg via INTRAVENOUS
  Filled 2018-01-19: qty 1100

## 2018-01-19 MED ORDER — FLEET ENEMA 7-19 GM/118ML RE ENEM: 1.0000 | ENEMA | Freq: Once | RECTAL | Status: DC | PRN

## 2018-01-19 MED ORDER — HYDROCHLOROTHIAZIDE 12.5 MG PO CAPS
12.5000 mg | ORAL_CAPSULE | Freq: Every day | ORAL | Status: DC
  Administered 2018-01-21: 12.5 mg via ORAL
  Filled 2018-01-19 (×2): qty 1

## 2018-01-19 MED ORDER — SODIUM CHLORIDE 0.9 % IR SOLN
Status: DC | PRN
  Administered 2018-01-19: 1000 mL

## 2018-01-19 MED ORDER — DEXAMETHASONE SODIUM PHOSPHATE 10 MG/ML IJ SOLN
10.0000 mg | Freq: Once | INTRAMUSCULAR | Status: AC
  Administered 2018-01-20: 10 mg via INTRAVENOUS
  Filled 2018-01-19: qty 1

## 2018-01-19 MED ORDER — PRAVASTATIN SODIUM 40 MG PO TABS
40.0000 mg | ORAL_TABLET | Freq: Every day | ORAL | Status: DC
  Administered 2018-01-19 – 2018-01-26 (×8): 40 mg via ORAL
  Filled 2018-01-19 (×2): qty 1
  Filled 2018-01-19: qty 2
  Filled 2018-01-19: qty 1
  Filled 2018-01-19 (×2): qty 2
  Filled 2018-01-19 (×2): qty 1

## 2018-01-19 MED ORDER — LIDOCAINE 2% (20 MG/ML) 5 ML SYRINGE
INTRAMUSCULAR | Status: AC
  Filled 2018-01-19: qty 5

## 2018-01-19 MED ORDER — BUPIVACAINE IN DEXTROSE 0.75-8.25 % IT SOLN
INTRATHECAL | Status: DC | PRN
  Administered 2018-01-19: 1.6 mL via INTRATHECAL

## 2018-01-19 MED ORDER — CLONIDINE HCL (ANALGESIA) 100 MCG/ML EP SOLN
EPIDURAL | Status: DC | PRN
  Administered 2018-01-19: 50 ug

## 2018-01-19 MED ORDER — SODIUM CHLORIDE 0.9 % IJ SOLN
INTRAMUSCULAR | Status: AC
  Filled 2018-01-19: qty 50

## 2018-01-19 MED ORDER — SODIUM CHLORIDE 0.9 % IV SOLN
INTRAVENOUS | Status: DC
  Administered 2018-01-19 – 2018-01-20 (×2): via INTRAVENOUS

## 2018-01-19 MED ORDER — FENTANYL CITRATE (PF) 100 MCG/2ML IJ SOLN
50.0000 ug | INTRAMUSCULAR | Status: DC
  Administered 2018-01-19: 50 ug via INTRAVENOUS
  Filled 2018-01-19: qty 2

## 2018-01-19 MED ORDER — CHLORHEXIDINE GLUCONATE 4 % EX LIQD: 60.0000 mL | Freq: Once | CUTANEOUS | Status: DC

## 2018-01-19 MED ORDER — MIDAZOLAM HCL 2 MG/2ML IJ SOLN
1.0000 mg | INTRAMUSCULAR | Status: DC
  Administered 2018-01-19: 1 mg via INTRAVENOUS
  Filled 2018-01-19: qty 2

## 2018-01-19 SURGICAL SUPPLY — 51 items
BAG DECANTER FOR FLEXI CONT (MISCELLANEOUS) ×1 IMPLANT
BAG SPEC THK2 15X12 ZIP CLS (MISCELLANEOUS) ×1
BAG ZIPLOCK 12X15 (MISCELLANEOUS) ×3 IMPLANT
BANDAGE ACE 6X5 VEL STRL LF (GAUZE/BANDAGES/DRESSINGS) ×3 IMPLANT
BLADE SAG 18X100X1.27 (BLADE) ×3 IMPLANT
BLADE SAW SGTL 11.0X1.19X90.0M (BLADE) ×3 IMPLANT
BOWL SMART MIX CTS (DISPOSABLE) ×3 IMPLANT
CAPT KNEE TOTAL 3 ATTUNE ×2 IMPLANT
CEMENT HV SMART SET (Cement) ×6 IMPLANT
CLOSURE WOUND 1/2 X4 (GAUZE/BANDAGES/DRESSINGS) ×2
COVER SURGICAL LIGHT HANDLE (MISCELLANEOUS) ×3 IMPLANT
CUFF TOURN SGL QUICK 34 (TOURNIQUET CUFF) ×3
CUFF TRNQT CYL 34X4X40X1 (TOURNIQUET CUFF) ×1 IMPLANT
DECANTER SPIKE VIAL GLASS SM (MISCELLANEOUS) ×3 IMPLANT
DRAPE TOP 10253 STERILE (DRAPES) IMPLANT
DRAPE U-SHAPE 47X51 STRL (DRAPES) ×3 IMPLANT
DRSG ADAPTIC 3X8 NADH LF (GAUZE/BANDAGES/DRESSINGS) ×3 IMPLANT
DRSG PAD ABDOMINAL 8X10 ST (GAUZE/BANDAGES/DRESSINGS) ×3 IMPLANT
DURAPREP 26ML APPLICATOR (WOUND CARE) ×3 IMPLANT
ELECT REM PT RETURN 15FT ADLT (MISCELLANEOUS) ×3 IMPLANT
EVACUATOR 1/8 PVC DRAIN (DRAIN) ×3 IMPLANT
GAUZE SPONGE 4X4 12PLY STRL (GAUZE/BANDAGES/DRESSINGS) ×3 IMPLANT
GLOVE BIO SURGEON STRL SZ7.5 (GLOVE) IMPLANT
GLOVE BIO SURGEON STRL SZ8 (GLOVE) ×3 IMPLANT
GLOVE BIOGEL PI IND STRL 6.5 (GLOVE) IMPLANT
GLOVE BIOGEL PI IND STRL 8 (GLOVE) ×1 IMPLANT
GLOVE BIOGEL PI INDICATOR 6.5 (GLOVE) ×2
GLOVE BIOGEL PI INDICATOR 8 (GLOVE) ×2
GLOVE SURG SS PI 6.5 STRL IVOR (GLOVE) ×2 IMPLANT
GOWN STRL REUS W/TWL LRG LVL3 (GOWN DISPOSABLE) ×5 IMPLANT
GOWN STRL REUS W/TWL XL LVL3 (GOWN DISPOSABLE) IMPLANT
HANDPIECE INTERPULSE COAX TIP (DISPOSABLE) ×3
IMMOBILIZER KNEE 20 (SOFTGOODS) ×3
IMMOBILIZER KNEE 20 THIGH 36 (SOFTGOODS) ×1 IMPLANT
MANIFOLD NEPTUNE II (INSTRUMENTS) ×3 IMPLANT
NS IRRIG 1000ML POUR BTL (IV SOLUTION) ×3 IMPLANT
PACK TOTAL KNEE CUSTOM (KITS) ×3 IMPLANT
PADDING CAST COTTON 6X4 STRL (CAST SUPPLIES) ×7 IMPLANT
POSITIONER SURGICAL ARM (MISCELLANEOUS) ×3 IMPLANT
SET HNDPC FAN SPRY TIP SCT (DISPOSABLE) ×1 IMPLANT
STRIP CLOSURE SKIN 1/2X4 (GAUZE/BANDAGES/DRESSINGS) ×4 IMPLANT
SUT MNCRL AB 4-0 PS2 18 (SUTURE) ×3 IMPLANT
SUT STRATAFIX 0 PDS 27 VIOLET (SUTURE) ×3
SUT VIC AB 2-0 CT1 27 (SUTURE) ×9
SUT VIC AB 2-0 CT1 TAPERPNT 27 (SUTURE) ×3 IMPLANT
SUTURE STRATFX 0 PDS 27 VIOLET (SUTURE) ×1 IMPLANT
SYR 50ML LL SCALE MARK (SYRINGE) ×1 IMPLANT
TRAY FOLEY W/METER SILVER 16FR (SET/KITS/TRAYS/PACK) ×3 IMPLANT
WATER STERILE IRR 1000ML POUR (IV SOLUTION) ×6 IMPLANT
WRAP KNEE MAXI GEL POST OP (GAUZE/BANDAGES/DRESSINGS) ×3 IMPLANT
YANKAUER SUCT BULB TIP 10FT TU (MISCELLANEOUS) ×3 IMPLANT

## 2018-01-19 NOTE — Progress Notes (Signed)
[place] for Warfarin Indication: mechanical Aortic valve, post-op L total knee arthroplasty  No Known Allergies  Patient Measurements: Height: 5' 5.5" (166.4 cm) Weight: 124 lb (56.2 kg) IBW/kg (Calculated) : 58.15  Vital Signs: Temp: 96.3 F (35.7 C) (05/06 1458) Temp Source: Oral (05/06 1010) BP: 141/63 (05/06 1458) Pulse Rate: 56 (05/06 1430)  Labs: No results for input(s): HGB, HCT, PLT, APTT, LABPROT, INR, HEPARINUNFRC, HEPRLOWMOCWT, CREATININE, CKTOTAL, CKMB, TROPONINI in the last 72 hours.  Estimated Creatinine Clearance: 54.7 mL/min (by C-G formula based on SCr of 0.74 mg/dL).  Medical History: Past Medical History:  Diagnosis Date  . Cataract    Right Eye  . Chronic back pain   . Cubital tunnel syndrome    left  . Diverticulosis   . History of aortic valve disorder   . History of [facility] spotted fever   . History of subacute bacterial endocarditis   . Hyperlipidemia   . Osteoarthritis of both knees    and hands  . Osteopenia   . Skin cancer    Face   Medications:  Scheduled:  . acetaminophen  1,000 mg Oral Q6H  . [START ON 01/20/2018] dexamethasone  10 mg Intravenous Once  . docusate sodium  100 mg Oral BID  . enoxaparin (LOVENOX) injection  40 mg Subcutaneous Q24H  . [START ON 01/20/2018] hydrochlorothiazide  12.5 mg Oral Daily  . pravastatin  40 mg Oral QHS   Assessment: 54 yoF s/p L TKA 5/6. Hx mechanical Aortic Valve on chronic Warfarin, home dose 3.75mg  Tu,Th,Sat; 7.5mg  other days, last dose 4/29.       No Lovenox bridge prior to surgery per [facility] anti-coag notes.  Lovenox 40mg  SQ daily ordered post-operatively as bridge to therapeutic INR.  Today, 01/19/2018 Baseline INR 1.03  Goal of Therapy:  Goal INR 2.5-3.5 per [facility] anti-coag clinic notes Monitor platelets by anticoagulation protocol: Yes   Plan:   Warfarin 7.5mg  today at 1800  Daily Protime/INR  Monitor CBC, s/s  bleeding  Minda Ditto PharmD Pager [phone] 01/19/2018, 5:02 PM

## 2018-01-19 NOTE — Discharge Instructions (Signed)

## 2018-01-19 NOTE — Progress Notes (Signed)
AssistedDr. Turk with left, ultrasound guided, adductor canal block. Side rails up, monitors on throughout procedure. See vital signs in flow sheet. Tolerated Procedure well.  

## 2018-01-19 NOTE — Progress Notes (Addendum)
Pt went to coumadin clinic this morning to have PT/INR drawn. Results are in paper chart printed out from office.   PT: 11.2 INR 0.9  Dr. Gifford Shave notified of these results.  Verbal order to discontinue labs that were scheduled to be drawn.

## 2018-01-19 NOTE — Anesthesia Postprocedure Evaluation (Signed)
Anesthesia Post Note  Patient: Marissa Brown  Procedure(s) Performed: LEFT TOTAL KNEE ARTHROPLASTY (Left Knee)     Patient location during evaluation: PACU Anesthesia Type: Spinal Level of consciousness: awake and alert, awake and oriented Pain management: pain level controlled Vital Signs Assessment: post-procedure vital signs reviewed and stable Respiratory status: spontaneous breathing, nonlabored ventilation and respiratory function stable Cardiovascular status: blood pressure returned to baseline and stable Postop Assessment: no apparent nausea or vomiting, patient able to bend at knees, spinal receding, no backache and no headache Anesthetic complications: no    Last Vitals:  Vitals:   01/19/18 1458 01/19/18 1602  BP: (!) 141/63 134/61  Pulse:  62  Resp: 16 16  Temp: (!) 35.7 C   SpO2: 100%     Last Pain:  Vitals:   01/19/18 1430  TempSrc:   PainSc: 0-No pain                 Catalina Gravel

## 2018-01-19 NOTE — Interval H&P Note (Signed)
History and Physical Interval Note:  01/19/2018 10:15 AM  Marissa Brown  has presented today for surgery, with the diagnosis of Osteoarthritis Left knee  The various methods of treatment have been discussed with the patient and family. After consideration of risks, benefits and other options for treatment, the patient has consented to  Procedure(s): LEFT TOTAL KNEE ARTHROPLASTY (Left) as a surgical intervention .  The patient's history has been reviewed, patient examined, no change in status, stable for surgery.  I have reviewed the patient's chart and labs.  Questions were answered to the patient's satisfaction.     Pilar Plate Jasun Gasparini

## 2018-01-19 NOTE — Plan of Care (Signed)
Reviewed plan of care, specifically pain control, safety precautions, and IS use. Pt attentive and verbalized understanding of all education.

## 2018-01-19 NOTE — Anesthesia Procedure Notes (Signed)
Anesthesia Regional Block: Adductor canal block   Pre-Anesthetic Checklist: ,, timeout performed, Correct Patient, Correct Site, Correct Laterality, Correct Procedure, Correct Position, site marked, Risks and benefits discussed,  Surgical consent,  Pre-op evaluation,  At surgeon's request and post-op pain management  Laterality: Left  Prep: chloraprep       Needles:  Injection technique: Single-shot  Needle Type: Echogenic Needle     Needle Length: 9cm  Needle Gauge: 21     Additional Needles:   Procedures:,,,, ultrasound used (permanent image in chart),,,,  Narrative:  Start time: 01/19/2018 11:29 AM End time: 01/19/2018 11:34 AM Injection made incrementally with aspirations every 5 mL.  Performed by: Personally  Anesthesiologist: Catalina Gravel, MD  Additional Notes: No pain on injection. No increased resistance to injection. Injection made in 5cc increments.  Good needle visualization.  Patient tolerated procedure well.

## 2018-01-19 NOTE — Anesthesia Preprocedure Evaluation (Addendum)
Anesthesia Evaluation  Patient identified by MRN, date of birth, ID band Patient awake    Reviewed: Allergy & Precautions, NPO status , Patient's Chart, lab work & pertinent test results  Airway Mallampati: II  TM Distance: >3 FB Neck ROM: Full    Dental  (+) Teeth Intact, Dental Advisory Given   Pulmonary former smoker,    Pulmonary exam normal breath sounds clear to auscultation       Cardiovascular hypertension, Pt. on medications Normal cardiovascular exam+ Valvular Problems/Murmurs  Rhythm:Regular Rate:Normal  S/P AVR 1981, long term coumadin regimen  03/2017 ECHO: normal LVF, AVR working well with trivial AI   Neuro/Psych negative neurological ROS  negative psych ROS   GI/Hepatic negative GI ROS, Neg liver ROS,   Endo/Other  negative endocrine ROS  Renal/GU negative Renal ROS     Musculoskeletal  (+) Arthritis , Osteoarthritis,    Abdominal   Peds  Hematology  (+) Blood dyscrasia (Warfarin--stopped 6 days ago), , INR 0.9 on 01/19/18   Anesthesia Other Findings Day of surgery medications reviewed with the patient.  Reproductive/Obstetrics                         Anesthesia Physical Anesthesia Plan  ASA: III  Anesthesia Plan: Spinal   Post-op Pain Management:  Regional for Post-op pain   Induction:   PONV Risk Score and Plan: 2 and Propofol infusion, Dexamethasone and Ondansetron  Airway Management Planned: Simple Face Mask  Additional Equipment:   Intra-op Plan:   Post-operative Plan:   Informed Consent: I have reviewed the patients History and Physical, chart, labs and discussed the procedure including the risks, benefits and alternatives for the proposed anesthesia with the patient or authorized representative who has indicated his/her understanding and acceptance.   Dental advisory given  Plan Discussed with: CRNA, Anesthesiologist and Surgeon  Anesthesia Plan Comments:  (Discussed risks and benefits of and differences between spinal and general. Discussed risks of spinal including headache, backache, failure, bleeding, infection, and nerve damage. Patient consents to spinal. Questions answered. Coagulation studies and platelet count acceptable.)        Anesthesia Quick Evaluation

## 2018-01-19 NOTE — Anesthesia Procedure Notes (Signed)
Spinal  Patient location during procedure: OR End time: 01/19/2018 12:33 PM Staffing Resident/CRNA: Caryl Pina T, CRNA Performed: resident/CRNA  Preanesthetic Checklist Completed: patient identified, site marked, surgical consent, pre-op evaluation, timeout performed, IV checked, risks and benefits discussed and monitors and equipment checked Spinal Block Patient position: sitting Prep: ChloraPrep Patient monitoring: heart rate, cardiac monitor, blood pressure and continuous pulse ox Approach: midline Location: L4-5 Injection technique: single-shot Needle Needle type: Pencan  Needle gauge: 24 G Needle length: 9 cm Assessment Sensory level: T6 Additional Notes Expiration date of kit checked and confirmed. Patient tolerated procedure well, without complications.

## 2018-01-19 NOTE — Transfer of Care (Signed)
Immediate Anesthesia Transfer of Care Note  Patient: Marissa Brown  Procedure(s) Performed: LEFT TOTAL KNEE ARTHROPLASTY (Left Knee)  Patient Location: PACU  Anesthesia Type:Spinal  Level of Consciousness: awake, alert , oriented and patient cooperative  Airway & Oxygen Therapy: Patient Spontanous Breathing and Patient connected to face mask oxygen  Post-op Assessment: Report given to RN, Post -op Vital signs reviewed and stable and Patient moving all extremities  Post vital signs: Reviewed and stable  Last Vitals:  Vitals Value Taken Time  BP 124/68 01/19/2018  1:54 PM  Temp    Pulse 62 01/19/2018  1:55 PM  Resp 17 01/19/2018  1:55 PM  SpO2 100 % 01/19/2018  1:55 PM  Vitals shown include unvalidated device data.  Last Pain:  Vitals:   01/19/18 1137  TempSrc:   PainSc: 0-No pain      Patients Stated Pain Goal: 4 (38/45/36 [id number])  Complications: No apparent anesthesia complications

## 2018-01-19 NOTE — Op Note (Signed)
OPERATIVE REPORT-TOTAL KNEE ARTHROPLASTY   Pre-operative diagnosis- Osteoarthritis  Left knee(s)  Post-operative diagnosis- Osteoarthritis Left knee(s)  Procedure-  Left  Total Knee Arthroplasty  Surgeon- Dione Plover. Monalisa Bayless, MD  Assistant- Ardeen Jourdain, PA-C   Anesthesia-  Adductor canal block and spinal  EBL-30 mL   Drains Hemovac  Tourniquet time-  Total Tourniquet Time Documented: Thigh (Left) - 34 minutes Total: Thigh (Left) - 34 minutes     Complications- None  Condition-PACU - hemodynamically stable.   Brief Clinical Note  Marissa Brown is a 77 y.o. year old female with end stage OA of her left knee with progressively worsening pain and dysfunction. She has constant pain, with activity and at rest and significant functional deficits with difficulties even with ADLs. She has had extensive non-op management including analgesics, injections of cortisone and viscosupplements, and home exercise program, but remains in significant pain with significant dysfunction. Radiographs show bone on bone arthritis medial and patellofemoral. She presents now for left Total Knee Arthroplasty.    Procedure in detail---   The patient is brought into the operating room and positioned supine on the operating table. After successful administration of  ,Adductor canal block and spinal   a tourniquet is placed high on the  Left thigh(s) and the lower extremity is prepped and draped in the usual sterile fashion. Time out is performed by the operating team and then the  Left lower extremity is wrapped in Esmarch, knee flexed and the tourniquet inflated to 300 mmHg.       A midline incision is made with a ten blade through the subcutaneous tissue to the level of the extensor mechanism. A fresh blade is used to make a medial parapatellar arthrotomy. Soft tissue over the proximal medial tibia is subperiosteally elevated to the joint line with a knife and into the semimembranosus bursa with a Cobb  elevator. Soft tissue over the proximal lateral tibia is elevated with attention being paid to avoiding the patellar tendon on the tibial tubercle. The patella is everted, knee flexed 90 degrees and the ACL and PCL are removed. Findings are bone on bone medial and patellofemoral with large global osteophytes.        The drill is used to create a starting hole in the distal femur and the canal is thoroughly irrigated with sterile saline to remove the fatty contents. The 5 degree Left  valgus alignment guide is placed into the femoral canal and the distal femoral cutting block is pinned to remove 9 mm off the distal femur. Resection is made with an oscillating saw.      The tibia is subluxed forward and the menisci are removed. The extramedullary alignment guide is placed referencing proximally at the medial aspect of the tibial tubercle and distally along the second metatarsal axis and tibial crest. The block is pinned to remove 35mm off the more deficient medial  side. Resection is made with an oscillating saw. Size 5is the most appropriate size for the tibia and the proximal tibia is prepared with the modular drill and keel punch for that size.      The femoral sizing guide is placed and size 5 is most appropriate. Rotation is marked off the epicondylar axis and confirmed by creating a rectangular flexion gap at 90 degrees. The size 5 cutting block is pinned in this rotation and the anterior, posterior and chamfer cuts are made with the oscillating saw. The intercondylar block is then placed and that cut is made.  Trial size 5 tibial component, trial size 5 posterior stabilized femur and a 10  mm posterior stabilized rotating platform insert trial is placed. Full extension is achieved with excellent varus/valgus and anterior/posterior balance throughout full range of motion. The patella is everted and thickness measured to be 22  mm. Free hand resection is taken to 12 mm, a 38 template is placed, lug holes  are drilled, trial patella is placed, and it tracks normally. Osteophytes are removed off the posterior femur with the trial in place. All trials are removed and the cut bone surfaces prepared with pulsatile lavage. Cement is mixed and once ready for implantation, the size 5 tibial implant, size  5 posterior stabilized femoral component, and the size 38 patella are cemented in place and the patella is held with the clamp. The trial insert is placed and the knee held in full extension. The Exparel (20 ml mixed with 60 ml saline) is injected into the extensor mechanism, posterior capsule, medial and lateral gutters and subcutaneous tissues.  All extruded cement is removed and once the cement is hard the permanent 10 mm posterior stabilized rotating platform insert is placed into the tibial tray.      The wound is copiously irrigated with saline solution and the extensor mechanism closed over a hemovac drain with #1 V-loc suture. The tourniquet is released for a total tourniquet time of 34  minutes. Flexion against gravity is 140 degrees and the patella tracks normally. Subcutaneous tissue is closed with 2.0 vicryl and subcuticular with running 4.0 Monocryl. The incision is cleaned and dried and steri-strips and a bulky sterile dressing are applied. The limb is placed into a knee immobilizer and the patient is awakened and transported to recovery in stable condition.      Please note that a surgical assistant was a medical necessity for this procedure in order to perform it in a safe and expeditious manner. Surgical assistant was necessary to retract the ligaments and vital neurovascular structures to prevent injury to them and also necessary for proper positioning of the limb to allow for anatomic placement of the prosthesis.   Dione Plover Lorice Lafave, MD    01/19/2018, 1:31 PM

## 2018-01-20 LAB — BASIC METABOLIC PANEL
ANION GAP: 7 (ref 5–15)
BUN: 11 mg/dL (ref 6–20)
CHLORIDE: 105 mmol/L (ref 101–111)
CO2: 25 mmol/L (ref 22–32)
Calcium: 9.2 mg/dL (ref 8.9–10.3)
Creatinine, Ser: 0.6 mg/dL (ref 0.44–1.00)
GFR calc Af Amer: >60 mL/min (ref >60)
GFR calc non Af Amer: >60 mL/min (ref >60)
GLUCOSE: 128 mg/dL — AB (ref 65–99)
Potassium: 4.7 mmol/L (ref 3.5–5.1)
Sodium: 137 mmol/L (ref 135–145)

## 2018-01-20 LAB — CBC
HEMATOCRIT: 31.7 % — AB (ref 36.0–46.0)
HEMOGLOBIN: 10.5 g/dL — AB (ref 12.0–15.0)
MCH: 32.5 pg (ref 26.0–34.0)
MCHC: 33.1 g/dL (ref 30.0–36.0)
MCV: 98.1 fL (ref 78.0–100.0)
PLATELETS: 171 10*3/uL (ref 150–400)
RBC: 3.23 MIL/uL — AB (ref 3.87–5.11)
RDW: 13.3 % (ref 11.5–15.5)
WBC: 11.6 10*3/uL — AB (ref 4.0–10.5)

## 2018-01-20 LAB — PROTIME-INR
INR: 1.07
Prothrombin Time: 13.8 seconds (ref 11.4–15.2)

## 2018-01-20 MED ORDER — WARFARIN SODIUM 5 MG PO TABS
7.5000 mg | ORAL_TABLET | Freq: Once | ORAL | Status: AC
  Administered 2018-01-20: 7.5 mg via ORAL
  Filled 2018-01-20 (×2): qty 1

## 2018-01-20 MED ORDER — LIP MEDEX EX OINT
TOPICAL_OINTMENT | CUTANEOUS | Status: AC
  Administered 2018-01-20: 09:00:00
  Filled 2018-01-20: qty 7

## 2018-01-20 NOTE — Progress Notes (Signed)
Physical Therapy Treatment Patient Details Name: Marissa Brown MRN: [id number] DOB: 1941/09/05 Today's Date: 01/20/2018    History of Present Illness 77 yo female s/p L TKA 01/19/18    PT Comments    Progressing with mobility. Plan is for d/c home on tomorrow.   Follow Up Recommendations  Follow surgeon's recommendation for DC plan and follow-up therapies     Equipment Recommendations  Rolling walker with 5" wheels    Recommendations for Other Services       Precautions / Restrictions Precautions Precautions: Fall;Knee Required Braces or Orthoses: Knee Immobilizer - Left Knee Immobilizer - Left: Discontinue once straight leg raise with < 10 degree lag Restrictions Weight Bearing Restrictions: No Other Position/Activity Restrictions: WBAT    Mobility  Bed Mobility Overal bed mobility: Needs Assistance Bed Mobility: Supine to Sit;Sit to Supine     Supine to sit: Min assist Sit to supine: Min assist   General bed mobility comments: small amount of assist for L LE  Transfers Overall transfer level: Needs assistance Equipment used: Rolling walker (2 wheeled) Transfers: Sit to/from Stand Sit to Stand: Min guard         General transfer comment: close guard for safety. VCs safety, hand/LE placement  Ambulation/Gait Ambulation/Gait assistance: Min guard Ambulation Distance (Feet): 80 Feet Assistive device: Rolling walker (2 wheeled) Gait Pattern/deviations: Step-to pattern;Step-through pattern;Decreased stride length     General Gait Details: Improved gait this session. Pt now able to DF L foot. Close guard for safety.    Stairs             Wheelchair Mobility    Modified Rankin (Stroke Patients Only)       Balance                                            Cognition Arousal/Alertness: Awake/alert Behavior During Therapy: WFL for tasks assessed/performed Overall Cognitive Status: Within Functional Limits for tasks assessed                                         Exercises      General Comments        Pertinent Vitals/Pain Pain Assessment: 0-10 Pain Score: 5  Pain Location: L knee Pain Descriptors / Indicators: Aching;Sore Pain Intervention(s): Monitored during session;Repositioned    Home Living                      Prior Function            PT Goals (current goals can now be found in the care plan section) Acute Rehab PT Goals Patient Stated Goal: to regain independence PT Goal Formulation: With patient Time For Goal Achievement: 02/03/18 Potential to Achieve Goals: Good Progress towards PT goals: Progressing toward goals    Frequency    7X/week      PT Plan Current plan remains appropriate    Co-evaluation              AM-PAC PT "6 Clicks" Daily Activity  Outcome Measure  Difficulty turning over in bed (including adjusting bedclothes, sheets and blankets)?: None Difficulty moving from lying on back to sitting on the side of the bed? : A Little Difficulty sitting down on and standing up from a chair  with arms (e.g., wheelchair, bedside commode, etc,.)?: A Little Help needed moving to and from a bed to chair (including a wheelchair)?: A Little Help needed walking in hospital room?: A Little Help needed climbing 3-5 steps with a railing? : A Little 6 Click Score: 19    End of Session Equipment Utilized During Treatment: Gait belt;Left knee immobilizer Activity Tolerance: Patient tolerated treatment well Patient left: in chair;with call bell/phone within reach   PT Visit Diagnosis: Difficulty in walking, not elsewhere classified (R26.2);Other abnormalities of gait and mobility (R26.89)     Time: 2863-8177 PT Time Calculation (min) (ACUTE ONLY): 20 min  Charges:  $Gait Training: 8-22 mins                    G Codes:          Weston Anna, MPT Pager: [phone]

## 2018-01-20 NOTE — Progress Notes (Signed)
   Subjective: 1 Day Post-Op Procedure(s) (LRB): LEFT TOTAL KNEE ARTHROPLASTY (Left) Patient reports pain as mild.   Patient seen in rounds with Dr. Wynelle Link. Patient is well, and has had no acute complaints or problems. Doing well this morning. Reports that she is very hungry. No issues overnight.  We will start therapy today.  Plan is to go Home after hospital stay.  Objective: Vital signs in last 24 hours: Temp:  [96.3 F (35.7 C)-97.9 F (36.6 C)] 97.7 F (36.5 C) (05/07 0523) Pulse Rate:  [55-69] 61 (05/07 0523) Resp:  [12-25] 14 (05/06 1800) BP: (112-167)/(58-76) 123/62 (05/07 0523) SpO2:  [97 %-100 %] 100 % (05/07 0523) Weight:  [56.2 kg (124 lb)] 56.2 kg (124 lb) (05/06 1052)  Intake/Output from previous day:  Intake/Output Summary (Last 24 hours) at 01/20/2018 0705 Last data filed at 01/20/2018 0615 Gross per 24 hour  Intake 3758.75 ml  Output 2635 ml  Net 1123.75 ml     Labs: Recent Labs    01/20/18 0558  HGB 10.5*   Recent Labs    01/20/18 0558  WBC 11.6*  RBC 3.23*  HCT 31.7*  PLT 171   Recent Labs    01/20/18 0558  NA 137  K 4.7  CL 105  CO2 25  BUN 11  CREATININE 0.60  GLUCOSE 128*  CALCIUM 9.2   Recent Labs    01/19/18 1600 01/20/18 0558  INR 1.03 1.07    EXAM General - Patient is Alert and Oriented Extremity - Neurologically intact Intact pulses distally Dorsiflexion/Plantar flexion intact Compartment soft Dressing - dressing C/D/I Motor Function - intact, moving foot and toes well on exam.  Hemovac pulled without difficulty.  Past Medical History:  Diagnosis Date  . Cataract    Right Eye  . Chronic back pain   . Cubital tunnel syndrome    left  . Diverticulosis   . History of aortic valve disorder   . History of [facility] spotted fever   . History of subacute bacterial endocarditis   . Hyperlipidemia   . Osteoarthritis of both knees    and hands  . Osteopenia   . Skin cancer    Face    Assessment/Plan: 1  Day Post-Op Procedure(s) (LRB): LEFT TOTAL KNEE ARTHROPLASTY (Left) Principal Problem:   OA (osteoarthritis) of knee  Estimated body mass index is 20.32 kg/m as calculated from the following:   Height as of this encounter: 5' 5.5" (1.664 m).   Weight as of this encounter: 56.2 kg (124 lb). Advance diet Up with therapy D/C IV fluids when tolerating POs well  DVT Prophylaxis - Lovenox and Coumadin Weight-Bearing as tolerated  D/C O2 and Pulse OX and try on Room Air  Will begin therapy today. Plan for two sessions. Will be monitoring PT/INR. Lovenox with coumadin until therapeutic. Likely DC home tomorrow.   Ardeen Jourdain, PA-C Orthopaedic Surgery 01/20/2018, 7:05 AM

## 2018-01-20 NOTE — Evaluation (Signed)
Physical Therapy Evaluation Patient Details Name: Marissa Brown MRN: [id number] DOB: 05-12-1941 Today's Date: 01/20/2018   History of Present Illness  77 yo female s/p L TKA 01/19/18  Clinical Impression  On eval, pt required Min guard assist for mobility. She walked ~60 feet with a RW. While performing assessment, noted pt to have post-op drop foot on L-made RN aware. Will continue to progress activity as tolerated. Per chart, plan is for OP PT.     Follow Up Recommendations Follow surgeon's recommendation for DC plan and follow-up therapies    Equipment Recommendations  None recommended by PT    Recommendations for Other Services       Precautions / Restrictions Precautions Precautions: Fall;Knee Required Braces or Orthoses: Knee Immobilizer - Left Knee Immobilizer - Left: Discontinue once straight leg raise with < 10 degree lag Restrictions Weight Bearing Restrictions: No Other Position/Activity Restrictions: WBAT      Mobility  Bed Mobility Overal bed mobility: Needs Assistance Bed Mobility: Supine to Sit     Supine to sit: Min guard;HOB elevated     General bed mobility comments: close guard for safety.   Transfers Overall transfer level: Needs assistance Equipment used: Rolling walker (2 wheeled) Transfers: Sit to/from Stand Sit to Stand: Min guard         General transfer comment: close guard for safety. VCs safety, hand/LE placement  Ambulation/Gait Ambulation/Gait assistance: Min guard Ambulation Distance (Feet): 60 Feet Assistive device: Rolling walker (2 wheeled) Gait Pattern/deviations: Step-to pattern;Decreased dorsiflexion - left;Steppage     General Gait Details: VCs safety, sequence, distance from RW. Pt utilizing hip hike/steppage gait pattern due to lack of dorsiflexion L foot  Stairs            Wheelchair Mobility    Modified Rankin (Stroke Patients Only)       Balance                                              Pertinent Vitals/Pain Pain Assessment: 0-10 Pain Score: 6  Pain Location: L knee Pain Descriptors / Indicators: Aching;Sore Pain Intervention(s): Monitored during session;Repositioned;Ice applied    Home Living Family/patient expects to be discharged to:: Private residence Living Arrangements: Spouse/significant other Available Help at Discharge: Family Type of Home: House Home Access: Stairs to enter Entrance Stairs-Rails: Right Entrance Stairs-Number of Steps: 4 Home Layout: One level   Additional Comments: 4 wheeled walker    Prior Function Level of Independence: Independent               Hand Dominance        Extremity/Trunk Assessment   Upper Extremity Assessment Upper Extremity Assessment: Overall WFL for tasks assessed    Lower Extremity Assessment Lower Extremity Assessment: LLE deficits/detail LLE Deficits / Details: DF 0/5. Pt is unable to atively dorsiflex L foot    Cervical / Trunk Assessment Cervical / Trunk Assessment: Normal  Communication   Communication: No difficulties  Cognition Arousal/Alertness: Awake/alert Behavior During Therapy: WFL for tasks assessed/performed Overall Cognitive Status: Within Functional Limits for tasks assessed                                        General Comments      Exercises Total Joint Exercises Ankle Circles/Pumps: AROM;Right;10  reps;Supine Quad Sets: AROM;Both;10 reps;Supine Heel Slides: AAROM;Left;10 reps;Supine Hip ABduction/ADduction: AROM;Left;10 reps;Supine Straight Leg Raises: AROM;Left;10 reps;Supine Goniometric ROM: ~10-60 degrees   Assessment/Plan    PT Assessment Patient needs continued PT services  PT Problem List Decreased strength;Decreased range of motion;Decreased mobility;Decreased balance;Decreased activity tolerance;Decreased knowledge of use of DME;Pain       PT Treatment Interventions DME instruction;Gait training;Functional mobility  training;Therapeutic activities;Balance training;Patient/family education;Stair training;Therapeutic exercise    PT Goals (Current goals can be found in the Care Plan section)  Acute Rehab PT Goals Patient Stated Goal: to regain independence PT Goal Formulation: With patient Time For Goal Achievement: 02/03/18 Potential to Achieve Goals: Good    Frequency 7X/week   Barriers to discharge        Co-evaluation               AM-PAC PT "6 Clicks" Daily Activity  Outcome Measure Difficulty turning over in bed (including adjusting bedclothes, sheets and blankets)?: None Difficulty moving from lying on back to sitting on the side of the bed? : A Little Difficulty sitting down on and standing up from a chair with arms (e.g., wheelchair, bedside commode, etc,.)?: A Little Help needed moving to and from a bed to chair (including a wheelchair)?: A Little Help needed walking in hospital room?: A Little Help needed climbing 3-5 steps with a railing? : A Little 6 Click Score: 19    End of Session Equipment Utilized During Treatment: Gait belt;Left knee immobilizer Activity Tolerance: Patient tolerated treatment well Patient left: in chair;with call bell/phone within reach   PT Visit Diagnosis: Difficulty in walking, not elsewhere classified (R26.2);Other abnormalities of gait and mobility (R26.89)    Time: 2951-8841 PT Time Calculation (min) (ACUTE ONLY): 26 min   Charges:   PT Evaluation $PT Eval Low Complexity: 1 Low PT Treatments $Gait Training: 8-22 mins   PT G Codes:          Weston Anna, MPT Pager: [phone]

## 2018-01-20 NOTE — Progress Notes (Signed)
Spoke with patient at bedside. Confirmed plan for OP PT, already arranged. Needs a RW and 3n1, contacted [facility] to deliver to the room. [phone]

## 2018-01-20 NOTE — Progress Notes (Signed)
Marissa Brown for Warfarin Indication: mechanical Aortic valve, post-op L total knee arthroplasty  No Known Allergies  Patient Measurements: Height: 5' 5.5" (166.4 cm) Weight: 124 lb (56.2 kg) IBW/kg (Calculated) : 58.15  Vital Signs: Temp: 97.8 F (36.6 C) (05/07 1314) Temp Source: Oral (05/07 1314) BP: 142/62 (05/07 1314) Pulse Rate: 72 (05/07 1314)  Labs: Recent Labs    01/19/18 1600 01/20/18 0558  HGB  --  10.5*  HCT  --  31.7*  PLT  --  171  LABPROT 13.4 13.8  INR 1.03 1.07  CREATININE  --  0.60    Estimated Creatinine Clearance: 54.7 mL/min (by C-G formula based on SCr of 0.6 mg/dL).  Medications:  Scheduled:  . docusate sodium  100 mg Oral BID  . enoxaparin (LOVENOX) injection  40 mg Subcutaneous Q24H  . hydrochlorothiazide  12.5 mg Oral Daily  . pravastatin  40 mg Oral QHS  . Warfarin - Pharmacist Dosing Inpatient   Does not apply q1800   Assessment: 64 yoF s/p L TKA 5/6. Hx mechanical Aortic Valve on chronic Warfarin, home dose 3.75mg  Tu,Th,Sat; 7.5mg  other days, last dose 4/29.   Per [facility] notes, "After discussion with Dr Donnetta Hutching, I called pt with update that she does not require lovenox bridge prior to surgery, she will start holding her warfarin on 01/13/18 until her surgery 01/19/18, and then after surgery she will take 1 extra tablet of warfarin for 2 days then continue her usual dosing and see me 7 days later"  - Lovenox 40mg  SQ daily ordered post-operatively in place of bridge to therapeutic INR.  Today, 01/20/2018  Baseline INR 1.03  CBC: hgb ~2g low as expected postop; Plt wnl  INR remains subtherapeutic as expected after 1 dose postop  Eating 100% of meals  No reported bleeding  Interacting meds: none  Goal of Therapy:  Goal INR 2.5-3.5 per [facility] notes Monitor platelets by anticoagulation protocol: Yes   Plan:   Warfarin 7.5 mg x1 today at 1800 (double  dose)  Daily Protime/INR  Monitor CBC, s/s bleeding  Reuel Boom, PharmD, BCPS [phone] 01/20/2018, 1:42 PM

## 2018-01-21 LAB — CBC
HEMATOCRIT: 29.1 % — AB (ref 36.0–46.0)
HEMOGLOBIN: 9.7 g/dL — AB (ref 12.0–15.0)
MCH: 32.9 pg (ref 26.0–34.0)
MCHC: 33.3 g/dL (ref 30.0–36.0)
MCV: 98.6 fL (ref 78.0–100.0)
Platelets: 167 10*3/uL (ref 150–400)
RBC: 2.95 MIL/uL — ABNORMAL LOW (ref 3.87–5.11)
RDW: 13.4 % (ref 11.5–15.5)
WBC: 12.3 10*3/uL — ABNORMAL HIGH (ref 4.0–10.5)

## 2018-01-21 LAB — BASIC METABOLIC PANEL
Anion gap: 7 (ref 5–15)
BUN: 13 mg/dL (ref 6–20)
CHLORIDE: 104 mmol/L (ref 101–111)
CO2: 25 mmol/L (ref 22–32)
CREATININE: 0.67 mg/dL (ref 0.44–1.00)
Calcium: 8.8 mg/dL — ABNORMAL LOW (ref 8.9–10.3)
GFR calc Af Amer: >60 mL/min (ref >60)
GFR calc non Af Amer: >60 mL/min (ref >60)
Glucose, Bld: 110 mg/dL — ABNORMAL HIGH (ref 65–99)
Potassium: 4.2 mmol/L (ref 3.5–5.1)
Sodium: 136 mmol/L (ref 135–145)

## 2018-01-21 LAB — PROTIME-INR
INR: 1.32
PROTHROMBIN TIME: 16.3 s — AB (ref 11.4–15.2)

## 2018-01-21 MED ORDER — OXYCODONE HCL 5 MG PO TABS: 5.0000 mg | ORAL_TABLET | ORAL | 0 refills | Status: DC | PRN

## 2018-01-21 MED ORDER — HYDROGEN PEROXIDE 3 % EX SOLN
CUTANEOUS | Status: AC
Start: 2018-01-21 — End: 2018-01-22
  Filled 2018-01-21: qty 473

## 2018-01-21 MED ORDER — SODIUM CHLORIDE 0.9 % IV BOLUS
250.0000 mL | Freq: Once | INTRAVENOUS | Status: AC
  Administered 2018-01-21: 250 mL via INTRAVENOUS

## 2018-01-21 MED ORDER — METHOCARBAMOL 500 MG PO TABS: 500.0000 mg | ORAL_TABLET | Freq: Four times a day (QID) | ORAL | 0 refills | Status: DC | PRN

## 2018-01-21 MED ORDER — WARFARIN SODIUM 10 MG PO TABS
11.2500 mg | ORAL_TABLET | Freq: Once | ORAL | Status: AC
  Administered 2018-01-21: 11.25 mg via ORAL
  Filled 2018-01-21: qty 1

## 2018-01-21 MED ORDER — WARFARIN SODIUM 7.5 MG PO TABS
11.2500 mg | ORAL_TABLET | Freq: Once | ORAL | Status: DC
  Filled 2018-01-21: qty 1.5

## 2018-01-21 MED ORDER — TRAMADOL HCL 50 MG PO TABS: 50.0000 mg | ORAL_TABLET | Freq: Four times a day (QID) | ORAL | 0 refills | Status: DC | PRN

## 2018-01-21 MED ORDER — ENOXAPARIN SODIUM 40 MG/0.4ML ~~LOC~~ SOLN: 40.0000 mg | SUBCUTANEOUS | 0 refills | Status: DC

## 2018-01-21 MED ORDER — ONDANSETRON HCL 4 MG PO TABS: 4.0000 mg | ORAL_TABLET | Freq: Four times a day (QID) | ORAL | 0 refills | Status: DC | PRN

## 2018-01-21 NOTE — Progress Notes (Signed)
Per [employer], patient needs her PT/INR checked Friday for warfarin use. HH RN was ordered but patient going to OutPatient PT and stated she will have it checked at [facility], where she was being seen prior to admission. Patient aware that she needs to go Friday to have levels checked.

## 2018-01-21 NOTE — Progress Notes (Signed)
Physical Therapy Treatment Patient Details Name: Marissa Brown MRN: [id number] DOB: June 01, 1941 Today's Date: 01/21/2018    History of Present Illness 77 yo female s/p L TKA 01/19/18    PT Comments    Attempted 2nd session prior to possible d/c today. Pt still unable to tolerate ambulation outside of room with PT. Pt c/o feeling "woozy"/dizzy. Assessed BP once back in supine-105/53. Pt stated that is a low BP for her and she doesn't feel she is reacting to pain meds well. Do not feel pt is safe to d/c home on today (she has been unable to meet her PT goals)-made RNs aware. Will continue to follow and progress activity as able.     Follow Up Recommendations  Follow surgeon's recommendation for DC plan and follow-up therapies     Equipment Recommendations  Rolling walker with 5" wheels    Recommendations for Other Services       Precautions / Restrictions Precautions Precautions: Fall;Knee Required Braces or Orthoses: Knee Immobilizer - Left Knee Immobilizer - Left: Discontinue once straight leg raise with < 10 degree lag Restrictions Weight Bearing Restrictions: No Other Position/Activity Restrictions: WBAT    Mobility  Bed Mobility Overal bed mobility: Needs Assistance Bed Mobility: Supine to Sit;Sit to Supine     Supine to sit: Min assist Sit to supine: Min assist   General bed mobility comments: assist for L LE  Transfers Overall transfer level: Needs assistance Equipment used: Rolling walker (2 wheeled) Transfers: Sit to/from Stand Sit to Stand: Min guard         General transfer comment: close guard for safety. VCs safety, hand placement. Pt stood for ~15 seconds then immediately sat back down and rest her head on her hand and RW handle. Pt then requested to return to bed.   Ambulation/Gait                 Stairs             Wheelchair Mobility    Modified Rankin (Stroke Patients Only)       Balance                                             Cognition Arousal/Alertness: Awake/alert Behavior During Therapy: WFL for tasks assessed/performed Overall Cognitive Status: Within Functional Limits for tasks assessed                                        Exercises      General Comments        Pertinent Vitals/Pain Pain Assessment: 0-10 Pain Score: 7  Pain Location: L knee Pain Descriptors / Indicators: Aching;Sore Pain Intervention(s): Limited activity within patient's tolerance;Repositioned;Ice applied    Home Living                      Prior Function            PT Goals (current goals can now be found in the care plan section) Progress towards PT goals: Not progressing toward goals - comment(unable to ambulate with PT outside of room on today due to c/o dizziness/"woozy")    Frequency    7X/week      PT Plan Current plan remains appropriate    Co-evaluation  AM-PAC PT "6 Clicks" Daily Activity  Outcome Measure  Difficulty turning over in bed (including adjusting bedclothes, sheets and blankets)?: A Little Difficulty moving from lying on back to sitting on the side of the bed? : Unable Difficulty sitting down on and standing up from a chair with arms (e.g., wheelchair, bedside commode, etc,.)?: A Little Help needed moving to and from a bed to chair (including a wheelchair)?: A Little Help needed walking in hospital room?: A Little Help needed climbing 3-5 steps with a railing? : A Lot 6 Click Score: 15    End of Session Equipment Utilized During Treatment: Gait belt;Left knee immobilizer Activity Tolerance: Other (comment);Patient limited by fatigue(limited by dizziness) Patient left: in bed;with call bell/phone within reach Nurse Communication: (made nurse(s) aware that pt was not safe to d/c home today) PT Visit Diagnosis: Difficulty in walking, not elsewhere classified (R26.2);Other abnormalities of gait and mobility (R26.89)      Time: 9211-9417 PT Time Calculation (min) (ACUTE ONLY): 16 min  Charges:  $Therapeutic Activity: 8-22 mins                    G Codes:          Weston Anna, MPT Pager: [phone]

## 2018-01-21 NOTE — Progress Notes (Signed)
Physical Therapy Treatment Patient Details Name: Marissa Brown MRN: [id number] DOB: [birth year] Today's Date: 01/21/2018    History of Present Illness 77 yo female s/p L TKA 01/19/18    PT Comments    Limited session this am. Pt was only able to tolerate ROM exercises this session. Sat EOB to finish exercises and pt c/o dizziness. She sat EOB for a few more minutes before requesting to return to supine. Will plan to have a 2nd session to continue training and education. Plan is for d/c later today if she does well.     Follow Up Recommendations  Follow surgeon's recommendation for DC plan and follow-up therapies     Equipment Recommendations  Rolling walker with 5" wheels    Recommendations for Other Services       Precautions / Restrictions Precautions Precautions: Fall;Knee Required Braces or Orthoses: Knee Immobilizer - Left Knee Immobilizer - Left: Discontinue once straight leg raise with < 10 degree lag Restrictions Weight Bearing Restrictions: No Other Position/Activity Restrictions: WBAT    Mobility  Bed Mobility Overal bed mobility: Needs Assistance Bed Mobility: Supine to Sit;Sit to Supine     Supine to sit: Min guard;HOB elevated Sit to supine: Min assist   General bed mobility comments: small amount of assist for L LE  Transfers                 General transfer comment: unable to attempt due to pt c/o dizziness. She sat EOB for several minutes before requesting to return to supine.  Ambulation/Gait                 Stairs             Wheelchair Mobility    Modified Rankin (Stroke Patients Only)       Balance                                            Cognition Arousal/Alertness: Awake/alert Behavior During Therapy: WFL for tasks assessed/performed Overall Cognitive Status: Within Functional Limits for tasks assessed                                        Exercises Total Joint  Exercises Ankle Circles/Pumps: AROM;Right;10 reps;Supine Quad Sets: AROM;Both;10 reps;Supine Heel Slides: AAROM;Left;5 reps;Supine Hip ABduction/ADduction: AAROM;Left;10 reps;Supine Straight Leg Raises: AAROM;Left;10 reps;Supine Knee Flexion: AAROM;Left;5 reps;Seated Goniometric ROM: ~10-45 degrees (limited by pain)    General Comments        Pertinent Vitals/Pain Pain Assessment: 0-10 Pain Score: 7  Pain Location: L knee Pain Descriptors / Indicators: Aching;Sore Pain Intervention(s): Limited activity within patient's tolerance;Repositioned;Ice applied    Home Living                      Prior Function            PT Goals (current goals can now be found in the care plan section) Progress towards PT goals: Progressing toward goals    Frequency    7X/week      PT Plan Current plan remains appropriate    Co-evaluation              AM-PAC PT "6 Clicks" Daily Activity  Outcome Measure  Difficulty turning over in bed (including adjusting  bedclothes, sheets and blankets)?: A Little Difficulty moving from lying on back to sitting on the side of the bed? : A Little Difficulty sitting down on and standing up from a chair with arms (e.g., wheelchair, bedside commode, etc,.)?: Unable Help needed moving to and from a bed to chair (including a wheelchair)?: A Little Help needed walking in hospital room?: A Little Help needed climbing 3-5 steps with a railing? : A Lot 6 Click Score: 15    End of Session   Activity Tolerance: Patient limited by pain(limited by dizziness) Patient left: in bed;with call bell/phone within reach   PT Visit Diagnosis: Difficulty in walking, not elsewhere classified (R26.2);Other abnormalities of gait and mobility (R26.89)     Time: 4166-0630 PT Time Calculation (min) (ACUTE ONLY): 15 min  Charges:  $Therapeutic Exercise: 8-22 mins                    G Codes:         Weston Anna, MPT Pager: [phone]

## 2018-01-21 NOTE — Progress Notes (Signed)
Pt had a vagal response when she got up to the bedside commode. Pt became diaphoretic, pale, slightly lethargic and BP dropped into 80's. Once pt was laid down in bed, color returned and BP came back up. Pt also c/o feeling really weak and thinks she might have a UTI. Notified on -call provider. Received a call back from [facility]. No new orders at this time, just continue to monitor.

## 2018-01-21 NOTE — Progress Notes (Signed)
   Subjective: 2 Days Post-Op Procedure(s) (LRB): LEFT TOTAL KNEE ARTHROPLASTY (Left) Patient reports pain as moderate.  Patient seen in rounds with Dr. Wynelle Link. Patient is well. also c/o mild left lateral hip pain. Denies groin pain. Endorses nausea/vomiting yesterday, states feeling better this AM. Plan is to go Home after hospital stay.  Objective: Vital signs in last 24 hours: Temp:  [97.7 F (36.5 C)-98.5 F (36.9 C)] 98 F (36.7 C) (05/08 0501) Pulse Rate:  [59-78] 78 (05/08 0501) Resp:  [14-20] 20 (05/08 0501) BP: (124-148)/(57-82) 144/82 (05/08 0501) SpO2:  [97 %-100 %] 97 % (05/08 0501)  Intake/Output from previous day:  Intake/Output Summary (Last 24 hours) at 01/21/2018 0758 Last data filed at 01/21/2018 0445 Gross per 24 hour  Intake 1448.75 ml  Output 550 ml  Net 898.75 ml    Labs: Recent Labs    01/20/18 0558 01/21/18 0558  HGB 10.5* 9.7*   Recent Labs    01/20/18 0558 01/21/18 0558  WBC 11.6* 12.3*  RBC 3.23* 2.95*  HCT 31.7* 29.1*  PLT 171 167   Recent Labs    01/20/18 0558 01/21/18 0558  NA 137 136  K 4.7 4.2  CL 105 104  CO2 25 25  BUN 11 13  CREATININE 0.60 0.67  GLUCOSE 128* 110*  CALCIUM 9.2 8.8*   Recent Labs    01/20/18 0558 01/21/18 0558  INR 1.07 1.32   EXAM General - Patient is Alert, Appropriate and Oriented Extremity - Sensation intact distally Intact pulses distally Dorsiflexion/Plantar flexion intact Dressing/Incision - clean, dry Motor Function - intact, moving foot and toes well on exam.  Past Medical History:  Diagnosis Date  . Cataract    Right Eye  . Chronic back pain   . Cubital tunnel syndrome    left  . Diverticulosis   . History of aortic valve disorder   . History of [facility] spotted fever   . History of subacute bacterial endocarditis   . Hyperlipidemia   . Osteoarthritis of both knees    and hands  . Osteopenia   . Skin cancer    Face    Assessment/Plan: 2 Days Post-Op Procedure(s)  (LRB): LEFT TOTAL KNEE ARTHROPLASTY (Left) Principal Problem:   OA (osteoarthritis) of knee  Estimated body mass index is 20.32 kg/m as calculated from the following:   Height as of this encounter: 5' 5.5" (1.664 m).   Weight as of this encounter: 56.2 kg (124 lb). Up with therapy  DVT Prophylaxis - Lovenox and Coumadin Weight-Bearing as tolerated  Possible discharge to home today with outpatient therapy at [facility] left hip pain may be due to bursitis, informed patient to notify us if pain continued.   Ardeen Jourdain, PA-C Orthopaedic Surgery 01/21/2018, 7:58 AM

## 2018-01-21 NOTE — Progress Notes (Addendum)
[place] for Warfarin Indication: mechanical Aortic valve, post-op L total knee arthroplasty  No Known Allergies  Patient Measurements: Height: 5' 5.5" (166.4 cm) Weight: 124 lb (56.2 kg) IBW/kg (Calculated) : 58.15  Vital Signs: Temp: 97.8 F (36.6 C) (05/08 0909) Temp Source: Oral (05/08 0909) BP: 131/61 (05/08 0909) Pulse Rate: 72 (05/08 0909)  Labs: Recent Labs    01/19/18 1600 01/20/18 0558 01/21/18 0558  HGB  --  10.5* 9.7*  HCT  --  31.7* 29.1*  PLT  --  171 167  LABPROT 13.4 13.8 16.3*  INR 1.03 1.07 1.32  CREATININE  --  0.60 0.67    Estimated Creatinine Clearance: 54.7 mL/min (by C-G formula based on SCr of 0.67 mg/dL).  Medications:  Scheduled:  . docusate sodium  100 mg Oral BID  . enoxaparin (LOVENOX) injection  40 mg Subcutaneous Q24H  . hydrochlorothiazide  12.5 mg Oral Daily  . pravastatin  40 mg Oral QHS  . Warfarin - Pharmacist Dosing Inpatient   Does not apply q1800   Assessment: 71 yoF s/p L TKA 5/6. Hx mechanical Aortic Valve on chronic Warfarin, home dose 3.75mg  Tu,Th,Sat; 7.5mg  other days, last dose 4/29.   Per [facility] Anti-coag clinic notes, "After discussion with Dr Donnetta Hutching, I called pt with update that she does not require lovenox bridge prior to surgery, she will start holding her warfarin on 01/13/18 until her surgery 01/19/18, and then after surgery she will take 1 extra tablet of warfarin [15 mg total dose] for 2 days then continue her usual dosing and see me 7 days later"  - Lovenox 40mg  SQ daily ordered post-operatively in place of bridge to therapeutic INR.  Today, 01/21/2018  CBC: hgb ~2g low as expected postop; Plt wnl  INR remains subtherapeutic but rising appropriately  Eating 100% of meals  No reported bleeding  Interacting meds: none; on Lovenox ppx dose while INR subtherapeutic  Goal of Therapy:  Goal INR 2.5-3.5 per [facility] anti-coag clinic notes Monitor platelets by  anticoagulation protocol: Yes   Plan:   Warfarin 11.25 mg today prior to discharge.   Recommend sending patient home on 7.5 mg daily until INR > 2.5; planning for INR check on 5/10  Patient to receive Lovenox 40 mg for 3 more days after discharge  Daily Protime/INR  Monitor CBC, s/s bleeding  Reuel Boom, PharmD, BCPS [phone] 01/21/2018, 12:11 PM

## 2018-01-22 ENCOUNTER — Inpatient Hospital Stay (HOSPITAL_COMMUNITY): Payer: Medicare Other

## 2018-01-22 ENCOUNTER — Other Ambulatory Visit: Payer: Self-pay

## 2018-01-22 DIAGNOSIS — M25552 Pain in left hip: Secondary | ICD-10-CM

## 2018-01-22 DIAGNOSIS — D689 Coagulation defect, unspecified: Secondary | ICD-10-CM

## 2018-01-22 DIAGNOSIS — N10 Acute tubulo-interstitial nephritis: Secondary | ICD-10-CM

## 2018-01-22 DIAGNOSIS — M1710 Unilateral primary osteoarthritis, unspecified knee: Secondary | ICD-10-CM

## 2018-01-22 LAB — BASIC METABOLIC PANEL
Anion gap: 9 (ref 5–15)
BUN: 9 mg/dL (ref 6–20)
CALCIUM: 8.4 mg/dL — AB (ref 8.9–10.3)
CO2: 24 mmol/L (ref 22–32)
CREATININE: 0.55 mg/dL (ref 0.44–1.00)
Chloride: 103 mmol/L (ref 101–111)
GFR calc Af Amer: >60 mL/min (ref >60)
GLUCOSE: 99 mg/dL (ref 65–99)
POTASSIUM: 4.2 mmol/L (ref 3.5–5.1)
SODIUM: 136 mmol/L (ref 135–145)

## 2018-01-22 LAB — PROTIME-INR
INR: 2.06
PROTHROMBIN TIME: 23 s — AB (ref 11.4–15.2)

## 2018-01-22 LAB — CBC
HCT: 25.6 % — ABNORMAL LOW (ref 36.0–46.0)
Hemoglobin: 8.6 g/dL — ABNORMAL LOW (ref 12.0–15.0)
MCH: 33.1 pg (ref 26.0–34.0)
MCHC: 33.6 g/dL (ref 30.0–36.0)
MCV: 98.5 fL (ref 78.0–100.0)
Platelets: 163 10*3/uL (ref 150–400)
RBC: 2.6 MIL/uL — AB (ref 3.87–5.11)
RDW: 13.9 % (ref 11.5–15.5)
WBC: 9.4 10*3/uL (ref 4.0–10.5)

## 2018-01-22 LAB — URINALYSIS, ROUTINE W REFLEX MICROSCOPIC
Bacteria, UA: NONE SEEN
Bilirubin Urine: NEGATIVE
GLUCOSE, UA: NEGATIVE mg/dL
Ketones, ur: NEGATIVE mg/dL
Nitrite: NEGATIVE
Protein, ur: NEGATIVE mg/dL
SPECIFIC GRAVITY, URINE: 1.008 (ref 1.005–1.030)
pH: 7 (ref 5.0–8.0)

## 2018-01-22 LAB — GLUCOSE, CAPILLARY: GLUCOSE-CAPILLARY: 129 mg/dL — AB (ref 65–99)

## 2018-01-22 LAB — HEMOGLOBIN AND HEMATOCRIT, BLOOD
HCT: 29.4 % — ABNORMAL LOW (ref 36.0–46.0)
Hemoglobin: 10.2 g/dL — ABNORMAL LOW (ref 12.0–15.0)

## 2018-01-22 LAB — PREPARE RBC (CROSSMATCH)

## 2018-01-22 MED ORDER — ENOXAPARIN SODIUM 60 MG/0.6ML ~~LOC~~ SOLN
60.0000 mg | Freq: Two times a day (BID) | SUBCUTANEOUS | Status: DC
  Administered 2018-01-22: 60 mg via SUBCUTANEOUS
  Filled 2018-01-22 (×2): qty 0.6

## 2018-01-22 MED ORDER — ACETAMINOPHEN 325 MG PO TABS
325.0000 mg | ORAL_TABLET | Freq: Four times a day (QID) | ORAL | Status: AC | PRN
  Administered 2018-01-22 – 2018-01-23 (×4): 650 mg via ORAL
  Filled 2018-01-22 (×4): qty 2

## 2018-01-22 MED ORDER — WARFARIN SODIUM 5 MG PO TABS
7.5000 mg | ORAL_TABLET | Freq: Once | ORAL | Status: AC
  Administered 2018-01-22: 7.5 mg via ORAL
  Filled 2018-01-22: qty 1

## 2018-01-22 MED ORDER — SODIUM CHLORIDE 0.9 % IV SOLN
1.0000 g | INTRAVENOUS | Status: DC
  Administered 2018-01-22: 1 g via INTRAVENOUS
  Filled 2018-01-22: qty 1
  Filled 2018-01-22: qty 10

## 2018-01-22 MED ORDER — HYDRALAZINE HCL 20 MG/ML IJ SOLN
5.0000 mg | INTRAMUSCULAR | Status: DC | PRN
  Administered 2018-01-22: 5 mg via INTRAVENOUS
  Filled 2018-01-22: qty 1

## 2018-01-22 MED ORDER — SODIUM CHLORIDE 0.9 % IV SOLN
Freq: Once | INTRAVENOUS | Status: AC
  Administered 2018-01-22: 11:00:00 via INTRAVENOUS

## 2018-01-22 MED ORDER — SODIUM CHLORIDE 0.9 % IV BOLUS
1000.0000 mL | Freq: Once | INTRAVENOUS | Status: AC
  Administered 2018-01-22: 1000 mL via INTRAVENOUS

## 2018-01-22 NOTE — Progress Notes (Signed)
PT Cancellation Note  Patient Details Name: Marissa Brown MRN: [id number] DOB: December 27, 1940   Cancelled Treatment:     episode of syncope this morning, receiving blood and checked back in afternoon, orders to transfer to tele.  Will check back in am    Rica Koyanagi  PTA [facility]  Acute  Rehab Pager      [phone]

## 2018-01-22 NOTE — Consult Note (Addendum)
[facility] Consult Follow-up Note  Marissa Brown:[id number] DOB: August 16, 1941 DOA: 01/19/2018 PCP: Jonathon Resides, MD Requesting physician: alusio-constable Reason for consultation: medical complexities Attending service: Ortho  Impression/Recommendations: 1. Acute blood loss anemia contributing to syncope--prior to admission hemoglobin was in the 13-14 range and she is in the 8 range currently-at this time I think the patient will need 1 unit of blood and have requested Ms. Cecilio Asper to put in transfusion orders-I suspect that the blood loss into her left knee while she is coagulopathic may play a role in her acute drop-I will Hemoccult her stools in addition to ensure no further occult losses 2. Vasovagal syncope-possibly secondary to pain versus volume depletion/Abla as above-starting IV fluid as per my discussion with Ms. Constable-agree with discontinuation of both lisinopril and HCTZ although these are not nodal acting agents-I would have thought more of an effect with a beta-blocker or amlodipine-in any event I think she will need telemetry monitoring given her right bundle branch block to ensure that we catch any occult type of arrhythmia-this is doubtful based on the history however I have reviewed the EKG and it shows mainly PVCs and chronic right bundle branch block and I have compared this with prior EKGs 3. History of mechanical aortic valve repair-we will send off for actual echo report from Dr. Jesse Fall Cardiology's office and nursing is to inform this attending when the report is back-at this stage I would hold off on further echocardiogram 4. Unlikely but possible urinary tract infection UA was performed showed trace leukocytes negative nitrites and no bacteria-I will give one-time dose of Rocephin and if symptomatically any other issues arise we can continue this-I would not treat unless we have a positive culture beyond tomorrow 5. Chronic Coumadin use-this will have to  judiciously be weighed--is being transfused-I would start back Coumadin to keep her at a therapeutic range of 2.5-3.5 and we can always transfuse her up based on CBC in a.m.  It is imperative that if she stays below an INR of 2.5 she be bridged with Lovenox and I think we can do that today 6. Prior fracture of vertebral spine 03/19/2013-this may affect her mobility in addition to her recent knee surgery she may need skilled placement as per Ms. Constable  Code Status: Presumed full Family Communication: None Disposition Plan: We will assume careof this patient secondary to medical complexity as per my discussion with Ms. Cecilio Asper--- will be transferring her to a telemetry bed for cardio monitoring  Emika Tiano, MD  [facility] Pager [phone]  If 7PM-7AM, please contact night-coverage [email or web address] password [facility] 01/22/2018, 9:17 AM   LOS: 3 days   Brief narrative: 77 year old female who came in for routine left total knee repair--chart requested because of orthostasis hypotension and dizziness-rapid response called overnight--allegedly passed out and had a work-up inclusive of BMP UA EKG Was given normal saline and orthostatics noted with a drop to 60 attempting to bear down and pass urine Was a second attempt-the first attempt to get up at around 10 PM last night showed that she dropped to the systolics of 86V with diaphoresis and pallor Tells me was not bridged from Lovenox to Coumadin but rather was stopped off of her Coumadin 6 days prior to surgery which was on 5/6   Past medical history:  She has had a mechanical valve repair and is under the care of Dr. Jesse Fall and [facility] as well as her PCP Dr. Benard Rink  Dr. Darral Dash does perform  yearly echoes-last one performed 03/2017 showed normal heart function and valve function she was felt to be low risk at last office evaluation by cardiology 10/28/2017 when she saw Lewie Chamber  Known to have chronic right bundle branch  block  Severe urinary tract infection allegedly treated in January of this year by her PCP with IM shot of Rocephin and then oral medications which altered her INR significantly per patient    Other Consultants:  None at this time  Procedures:  None  Interim History: See above discussion Subjective: Patient feels fair she states that she needs to really pee but has not been able to really get out of bed and feels scared to do so-on sitting the patient up she tells me she feels "weak" when I attempt to determine what she means by "weak" or dizzy she is unable to further clarify She does however remember yesterday when she got up to the restroom on the second occasion that she did not recall the events that happened to her  Objective: Vitals:   01/21/18 2119 01/21/18 2120 01/22/18 0230 01/22/18 0636  BP: 139/72 (!) 151/64 (!) 99/57 (!) 146/61  Pulse: 75 73 83 86  Resp: 20 20 14 19   Temp: 98.6 F (37 C)   99.8 F (37.7 C)  TempSrc: Oral   Oral  SpO2: 95% 95%  97%  Weight:      Height:         Intake/Output Summary (Last 24 hours) at 01/22/2018 5188 Last data filed at 01/22/2018 0600 Gross per 24 hour  Intake 2330 ml  Output 0 ml  Net 2330 ml    Exam: General: Alert pleasant looking younger than stated age coherent no lymphadenopathy no thyromegaly slightly underweight chest is clinically clear no CVA tenderness S1-S2 loud blowing harsh murmur across precordium abdomen soft nontender left knee looks double the size of right knee red erythematous unable to bend she is able to bend the right knee she is also unable to lift the left leg off the bed dorsalis pedis is intact bilat  Data Reviewed: Basic Metabolic Panel: Recent Labs  Lab 01/20/18 0558 01/21/18 0558 01/22/18 0528  NA 137 136 136  K 4.7 4.2 4.2  CL 105 104 103  CO2 25 25 24   GLUCOSE 128* 110* 99  BUN 11 13 9   CREATININE 0.60 0.67 0.55  CALCIUM 9.2 8.8* 8.4*   Liver Function Tests: No results for  input(s): AST, ALT, ALKPHOS, BILITOT, PROT, ALBUMIN in the last 168 hours. No results for input(s): LIPASE, AMYLASE in the last 168 hours. No results for input(s): AMMONIA in the last 168 hours. CBC: Recent Labs  Lab 01/20/18 0558 01/21/18 0558 01/22/18 0528  WBC 11.6* 12.3* 9.4  HGB 10.5* 9.7* 8.6*  HCT 31.7* 29.1* 25.6*  MCV 98.1 98.6 98.5  PLT 171 167 163   Cardiac Enzymes: No results for input(s): CKTOTAL, CKMB, CKMBINDEX, TROPONINI in the last 168 hours. BNP: Invalid input(s): POCBNP CBG: Recent Labs  Lab 01/22/18 0225  GLUCAP 129*    Recent Results (from the past 240 hour(s))  Surgical pcr screen     Status: None   Collection Time: 01/14/18  9:19 AM  Result Value Ref Range Status   MRSA, PCR NEGATIVE NEGATIVE Final   Staphylococcus aureus NEGATIVE NEGATIVE Final    Comment: (NOTE) The Xpert SA Assay (FDA approved for NASAL specimens in patients 25 years of age and older), is one component of a comprehensive surveillance program. It  is not intended to diagnose infection nor to guide or monitor treatment. Performed at [facility] [place] [address], [place], [place] [address]      Studies: No results found.  Scheduled Meds: . docusate sodium  100 mg Oral BID  . enoxaparin (LOVENOX) injection  40 mg Subcutaneous Q24H  . pravastatin  40 mg Oral QHS  . Warfarin - Pharmacist Dosing Inpatient   Does not apply q1800   Continuous Infusions: . sodium chloride Stopped (01/20/18 2000)  . sodium chloride

## 2018-01-22 NOTE — Care Management (Signed)
Called to give report at 14:40. Informed that nurse to call back to receive report.

## 2018-01-22 NOTE — Significant Event (Signed)
Rapid Response Event Note  Overview: Time Called: 0230 Event Type: Hypotension  Initial Focused Assessment:  Called to room due to report of patient "passing out" when on bedside commode.  Arrived to find patient sitting on bedside commode, currently A/Ox4, speech clear, and answering questions appropriately.  Primary nurse at bedside and stated that when she was attempting to urinate that she felt like she was going to pass out and then proceeded to pass out when seated on the bedside commode, almost immediately came back to.  Assited patient back to bed with no issues.  Assessment completed with no major issues identified, CBG done with result in 120s, all VS stable with current SBP in 130s lying flat.  After few minutes had patient sit on side of bed and re-took BP with SBP remaining in 130s, and HR staying in 80s.  Upon standing, when taking BP pt felt light headed and felt she was going to pass out, BP resulted with a SBP in the 60s.  Pt laid back in bed.  1L-NS bolus started.  BP rechecked with a SBP in 120s.  Pt remains A/Ox4.    Interventions:  !L-NS bolus  Plan of Care (if not transferred):  Currently Bedrest until MD can reassess activity level in the am, use purewick female external catheter for urination.  If any changes in patient condition re-notify rapid response nurse @ [phone].  Also notify PA that is on for patient about situation.  Event Summary:  Outcome: Stayed in room and stabalized  Event End Time: [place] ICU/SD Care Coordinator / Rapid Response Nurse

## 2018-01-22 NOTE — Care Management Important Message (Signed)
Important Message  Patient Details  Name: Marissa Brown MRN: [id number] Date of Birth: [birth year]   Medicare Important Message Given:  Yes    Kerin Salen 01/22/2018, 1:16 PMImportant Message  Patient Details  Name: Marissa Brown MRN: [id number] Date of Birth: 02-01-1941   Medicare Important Message Given:  Yes    Kerin Salen 01/22/2018, 1:16 PM

## 2018-01-22 NOTE — Progress Notes (Addendum)
Subjective: 3 Days Post-Op Procedure(s) (LRB): LEFT TOTAL KNEE ARTHROPLASTY (Left) Patient reports pain as mild.   Patient seen in rounds for Dr. Wynelle Link. Patient is having problems with dizziness as well as left hip pain. She had an incident last night in which she passed out when she was sitting up in bed. She has also been very limited with therapy as a result of the dizziness. She reports that in the past she has had instances like this when she has had a UTI. Voiding. Positive flatus but no BM yet. Denies SOB and chest pain. RR was called this AM at 230 for vagal episode. She has been on bed rest since then with fluids administered. She reports that she has more lateral left hip pain than she does in her knee. She says that her hip only bothers her when she is lying down, not with weight bearing.    Objective: Vital signs in last 24 hours: Temp:  [97.8 F (36.6 C)-99.8 F (37.7 C)] 99.8 F (37.7 C) (05/09 0636) Pulse Rate:  [53-98] 86 (05/09 0636) Resp:  [10-20] 19 (05/09 0636) BP: (82-151)/(50-85) 146/61 (05/09 0636) SpO2:  [95 %-99 %] 97 % (05/09 0636)  Intake/Output from previous day:  Intake/Output Summary (Last 24 hours) at 01/22/2018 0725 Last data filed at 01/22/2018 0600 Gross per 24 hour  Intake 2330 ml  Output 0 ml  Net 2330 ml     Labs: Recent Labs    01/20/18 0558 01/21/18 0558 01/22/18 0528  HGB 10.5* 9.7* 8.6*   Recent Labs    01/21/18 0558 01/22/18 0528  WBC 12.3* 9.4  RBC 2.95* 2.60*  HCT 29.1* 25.6*  PLT 167 163   Recent Labs    01/21/18 0558 01/22/18 0528  NA 136 136  K 4.2 4.2  CL 104 103  CO2 25 24  BUN 13 9  CREATININE 0.67 0.55  GLUCOSE 110* 99  CALCIUM 8.8* 8.4*   Recent Labs    01/21/18 0558 01/22/18 0528  INR 1.32 2.06    EXAM General - Patient is Alert and Oriented Extremity - Neurologically intact Intact pulses distally Dorsiflexion/Plantar flexion intact No cellulitis present Compartment soft Dressing/Incision -  clean, dry, no drainage Motor Function - intact, moving foot and toes well on exam.   Past Medical History:  Diagnosis Date  . Cataract    Right Eye  . Chronic back pain   . Cubital tunnel syndrome    left  . Diverticulosis   . History of aortic valve disorder   . History of [facility] spotted fever   . History of subacute bacterial endocarditis   . Hyperlipidemia   . Osteoarthritis of both knees    and hands  . Osteopenia   . Skin cancer    Face    Assessment/Plan: 3 Days Post-Op Procedure(s) (LRB): LEFT TOTAL KNEE ARTHROPLASTY (Left) Principal Problem:   OA (osteoarthritis) of knee  Estimated body mass index is 20.32 kg/m as calculated from the following:   Height as of this encounter: 5' 5.5" (1.664 m).   Weight as of this encounter: 56.2 kg (124 lb). Advance diet  DVT Prophylaxis - Lovenox and Coumadin Weight-Bearing as tolerated   Continue with IV fluids at 50 today. Hold HCTZ. Will get portable xray of the left hip to make sure that there is not bony abnormality but appears to be more from bursitis. May use ice or heat over the left hip, whichever is more comfortable for the patient. Will  attempt to get the patient up with therapy to see how her BP responds. Patient needs BP check upon sitting up and standing up. Hold pain medications, Tylenol only. Will get medical consult should dizziness continue. Hopeful for DC home tomorrow if the patient improves.Will keep her inpatient until prepared to DC home as SNF placement not encouraged. Likely one more day of Lovenox before patient will be in therapeutic range with coumadin 7.5mg .  Ardeen Jourdain, PA-C Orthopaedic Surgery 01/22/2018, 7:25 AM

## 2018-01-22 NOTE — Progress Notes (Signed)
[place] for Warfarin Indication: mechanical Aortic valve, post-op L total knee arthroplasty  No Known Allergies  Patient Measurements: Height: 5' 5.5" (166.4 cm) Weight: 124 lb (56.2 kg) IBW/kg (Calculated) : 58.15  Vital Signs: Temp: 98.7 F (37.1 C) (05/09 1357) Temp Source: Oral (05/09 1357) BP: 170/83 (05/09 1357) Pulse Rate: 84 (05/09 1357)  Labs: Recent Labs    01/20/18 0558 01/21/18 0558 01/22/18 0528  HGB 10.5* 9.7* 8.6*  HCT 31.7* 29.1* 25.6*  PLT 171 167 163  LABPROT 13.8 16.3* 23.0*  INR 1.07 1.32 2.06  CREATININE 0.60 0.67 0.55    Estimated Creatinine Clearance: 54.7 mL/min (by C-G formula based on SCr of 0.55 mg/dL).  Medications:  Scheduled:  . docusate sodium  100 mg Oral BID  . enoxaparin (LOVENOX) injection  60 mg Subcutaneous Q12H  . pravastatin  40 mg Oral QHS  . Warfarin - Pharmacist Dosing Inpatient   Does not apply q1800   Assessment: 70 yoF s/p L TKA 5/6. Hx mechanical Aortic Valve on chronic Warfarin, home dose 3.75mg  Tu,Th,Sat; 7.5mg  other days, last dose 4/29.   Per [facility] Anti-coag clinic notes, "After discussion with Dr Donnetta Hutching, I called pt with update that she does not require lovenox bridge prior to surgery, she will start holding her warfarin on 01/13/18 until her surgery 01/19/18, and then after surgery she will take 1 extra tablet of warfarin [15 mg total dose] for 2 days then continue her usual dosing and see me 7 days later"  Significant Events: - 5/7 Lovenox 40mg  SQ daily ordered post-operatively in place of bridge to therapeutic INR - 5/9 patient experienced syncopal event overnight d/t anemia. TRH giving PRBC and increasing Lovenox to therapeutic dose per Rx consult.  Today, 01/22/2018  CBC: hgb ~2g low as expected postop; Plt wnl  INR remains subtherapeutic but rising appropriately  Eating 100% of meals  No reported bleeding  Interacting meds: none; on Lovenox while INR  subtherapeutic  Goal of Therapy:  Goal INR 2.5-3.5 per [facility] anti-coag clinic notes LMWH Anti-Xa level 0.6-1.0 drawn 4 hrs post-dose Monitor platelets by anticoagulation protocol: Yes   Plan:   Warfarin 7.5 mg tonight (double dose)  Recommend sending patient home on 7.5 mg daily until INR > 2.5; planning for INR check on 5/10  Lovenox 60 mg SQ q12 hr  Daily Protime/INR  Monitor CBC, s/s bleeding  Reuel Boom, PharmD, BCPS [phone] 01/22/2018, 2:16 PM

## 2018-01-22 NOTE — Progress Notes (Signed)
Notified on call provider Merla Riches PA-C about pt passing out while on bedside commode and that rapid response was called to evaluate pt. New orders for BMP, UA, EKG & do not get pt out of bed for right now until assessed by MD this morning.

## 2018-01-23 ENCOUNTER — Inpatient Hospital Stay (HOSPITAL_COMMUNITY)
Admission: RE | Admit: 2018-01-23 | Discharge: 2018-01-23 | Disposition: A | Discharge status: Home / Self Care | Payer: Medicare Other | Source: Ambulatory Visit | Attending: Internal Medicine | Admitting: Internal Medicine

## 2018-01-23 ENCOUNTER — Inpatient Hospital Stay (HOSPITAL_COMMUNITY): Payer: Medicare Other

## 2018-01-23 DIAGNOSIS — R55 Syncope and collapse: Secondary | ICD-10-CM

## 2018-01-23 DIAGNOSIS — I361 Nonrheumatic tricuspid (valve) insufficiency: Secondary | ICD-10-CM

## 2018-01-23 DIAGNOSIS — I10 Essential (primary) hypertension: Secondary | ICD-10-CM

## 2018-01-23 DIAGNOSIS — M1712 Unilateral primary osteoarthritis, left knee: Principal | ICD-10-CM

## 2018-01-23 DIAGNOSIS — Z952 Presence of prosthetic heart valve: Secondary | ICD-10-CM

## 2018-01-23 LAB — COMPREHENSIVE METABOLIC PANEL
ALBUMIN: 3.3 g/dL — AB (ref 3.5–5.0)
ALK PHOS: 40 U/L (ref 38–126)
ALT: 22 U/L (ref 14–54)
ANION GAP: 12 (ref 5–15)
AST: 34 U/L (ref 15–41)
BILIRUBIN TOTAL: 0.8 mg/dL (ref 0.3–1.2)
BUN: 7 mg/dL (ref 6–20)
CALCIUM: 8.5 mg/dL — AB (ref 8.9–10.3)
CO2: 24 mmol/L (ref 22–32)
Chloride: 100 mmol/L — ABNORMAL LOW (ref 101–111)
Creatinine, Ser: 0.51 mg/dL (ref 0.44–1.00)
GFR calc Af Amer: >60 mL/min (ref >60)
GLUCOSE: 116 mg/dL — AB (ref 65–99)
POTASSIUM: 3.4 mmol/L — AB (ref 3.5–5.1)
Sodium: 136 mmol/L (ref 135–145)
TOTAL PROTEIN: 6.3 g/dL — AB (ref 6.5–8.1)

## 2018-01-23 LAB — BPAM RBC
BLOOD PRODUCT EXPIRATION DATE: 201905302359
Blood Product Expiration Date: 201905312359
Blood Product Expiration Date: 201906032359
ISSUE DATE / TIME: 201905091033
UNIT TYPE AND RH: 600
Unit Type and Rh: 600
Unit Type and Rh: 600

## 2018-01-23 LAB — CBC WITH DIFFERENTIAL/PLATELET
Basophils Absolute: 0 10*3/uL (ref 0.0–0.1)
Basophils Relative: 0 %
Eosinophils Absolute: 0.1 10*3/uL (ref 0.0–0.7)
Eosinophils Relative: 1 %
HEMATOCRIT: 30.6 % — AB (ref 36.0–46.0)
HEMOGLOBIN: 10.4 g/dL — AB (ref 12.0–15.0)
LYMPHS ABS: 0.8 10*3/uL (ref 0.7–4.0)
LYMPHS PCT: 8 %
MCH: 32.2 pg (ref 26.0–34.0)
MCHC: 34 g/dL (ref 30.0–36.0)
MCV: 94.7 fL (ref 78.0–100.0)
MONO ABS: 0.9 10*3/uL (ref 0.1–1.0)
Monocytes Relative: 8 %
NEUTROS ABS: 9.4 10*3/uL — AB (ref 1.7–7.7)
NEUTROS PCT: 83 %
Platelets: 186 10*3/uL (ref 150–400)
RBC: 3.23 MIL/uL — ABNORMAL LOW (ref 3.87–5.11)
RDW: 15 % (ref 11.5–15.5)
WBC: 11.2 10*3/uL — ABNORMAL HIGH (ref 4.0–10.5)

## 2018-01-23 LAB — PROTIME-INR
INR: 2.19
PROTHROMBIN TIME: 24.1 s — AB (ref 11.4–15.2)

## 2018-01-23 LAB — ECHOCARDIOGRAM COMPLETE
Height: 65.5 in
Weight: 1984 oz

## 2018-01-23 LAB — TYPE AND SCREEN
ABO/RH(D): A NEG
Antibody Screen: NEGATIVE
UNIT DIVISION: 0
Unit division: 0
Unit division: 0

## 2018-01-23 MED ORDER — METHOCARBAMOL 500 MG PO TABS: 500.0000 mg | ORAL_TABLET | Freq: Four times a day (QID) | ORAL | 0 refills | Status: DC | PRN

## 2018-01-23 MED ORDER — ENOXAPARIN SODIUM 60 MG/0.6ML ~~LOC~~ SOLN
60.0000 mg | Freq: Two times a day (BID) | SUBCUTANEOUS | Status: DC
  Administered 2018-01-23 (×2): 60 mg via SUBCUTANEOUS
  Filled 2018-01-23 (×2): qty 0.6

## 2018-01-23 MED ORDER — PREDNISONE 5 MG (21) PO TBPK
10.0000 mg | ORAL_TABLET | Freq: Every morning | ORAL | Status: AC
  Administered 2018-01-23: 10 mg via ORAL
  Filled 2018-01-23: qty 21

## 2018-01-23 MED ORDER — PREDNISONE 5 MG (21) PO TBPK
10.0000 mg | ORAL_TABLET | Freq: Every evening | ORAL | Status: AC
  Administered 2018-01-23: 10 mg via ORAL

## 2018-01-23 MED ORDER — PREDNISONE 5 MG (21) PO TBPK
10.0000 mg | ORAL_TABLET | Freq: Every evening | ORAL | Status: AC
  Administered 2018-01-24: 10 mg via ORAL

## 2018-01-23 MED ORDER — WARFARIN SODIUM 5 MG PO TABS
7.5000 mg | ORAL_TABLET | Freq: Once | ORAL | Status: AC
  Administered 2018-01-23: 7.5 mg via ORAL
  Filled 2018-01-23: qty 1

## 2018-01-23 MED ORDER — PREDNISONE 5 MG (21) PO TBPK
5.0000 mg | ORAL_TABLET | ORAL | Status: AC
  Administered 2018-01-23: 5 mg via ORAL

## 2018-01-23 MED ORDER — POTASSIUM CHLORIDE CRYS ER 20 MEQ PO TBCR
40.0000 meq | EXTENDED_RELEASE_TABLET | Freq: Once | ORAL | Status: AC
  Administered 2018-01-23: 40 meq via ORAL
  Filled 2018-01-23: qty 2

## 2018-01-23 MED ORDER — OXYCODONE HCL 5 MG PO TABS: 5.0000 mg | ORAL_TABLET | Freq: Four times a day (QID) | ORAL | 0 refills | Status: DC | PRN

## 2018-01-23 MED ORDER — PREDNISONE 5 MG (21) PO TBPK
5.0000 mg | ORAL_TABLET | Freq: Four times a day (QID) | ORAL | Status: DC
  Administered 2018-01-25 – 2018-01-27 (×8): 5 mg via ORAL

## 2018-01-23 MED ORDER — ENOXAPARIN SODIUM 60 MG/0.6ML ~~LOC~~ SOLN: 60.0000 mg | Freq: Two times a day (BID) | SUBCUTANEOUS | 0 refills | Status: DC

## 2018-01-23 MED ORDER — SODIUM CHLORIDE 0.9 % IV SOLN
INTRAVENOUS | Status: DC
  Administered 2018-01-23 – 2018-01-24 (×2): via INTRAVENOUS

## 2018-01-23 MED ORDER — PREDNISONE 5 MG (21) PO TBPK
5.0000 mg | ORAL_TABLET | Freq: Three times a day (TID) | ORAL | Status: AC
Start: 2018-01-24 — End: 2018-01-24
  Administered 2018-01-24 (×3): 5 mg via ORAL

## 2018-01-23 NOTE — Progress Notes (Signed)
Physical Therapy Treatment Patient Details Name: Marissa Brown MRN: [id number] DOB: 26-Feb-1941 Today's Date: 01/23/2018    History of Present Illness 77 yo female s/p L TKA 01/19/18    PT Comments    POD # 4 who was not seen for PT yesterday due to medical.  This morning pt reports feeling better.  EOB vitals General transfer comment: assisted to EOB vitals HR 94, RA 98, BP 113/74      No c/o dizziness      assisted with standing to amb to bathroom      General Gait Details: amb to bathroom and attempted to get a standing BP when pt started to c/o feeling "bad", eyes fixed, verbally unresponsive, seizure like muscle tightness throughout, falling backward and had to be lowered to toilet.  Emergency call light pulled.   Multiple nurses arrived to assist pt from toilet to recliner then back to bed.  Pt recalls event "I was in the bathroom".     Follow Up Recommendations  Follow surgeon's recommendation for DC plan and follow-up therapies     Equipment Recommendations  Rolling walker with 5" wheels;3in1 (PT)    Recommendations for Other Services       Precautions / Restrictions Precautions Precautions: Fall;Knee Restrictions Weight Bearing Restrictions: No Other Position/Activity Restrictions: WBAT    Mobility  Bed Mobility Overal bed mobility: Needs Assistance Bed Mobility: Supine to Sit     Supine to sit: Min assist     General bed mobility comments: assist for L LE and increased time  Transfers Overall transfer level: Needs assistance Equipment used: Rolling walker (2 wheeled) Transfers: Sit to/from Stand Sit to Stand: Min guard         General transfer comment: assisted to EOB vitals HR 94, RA 98, BP 113/74      No c/o dizziness      assisted with standing to amb to bathroom  Ambulation/Gait Ambulation/Gait assistance: Min guard;Min assist Ambulation Distance (Feet): 10 Feet(to bathroom) Assistive device: Rolling walker (2 wheeled) Gait Pattern/deviations: Step-to  pattern;Step-through pattern;Decreased stride length     General Gait Details: amb to bathroom and attempted to get a standing BP when pt started to c/o feeling "bad", eyes fixed, verbally unresponsive, seizure like muscle tightness throughout and had to be lowered to toilet.  Emergency call light pulled.     Stairs             Wheelchair Mobility    Modified Rankin (Stroke Patients Only)       Balance                                            Cognition Arousal/Alertness: Awake/alert Behavior During Therapy: WFL for tasks assessed/performed Overall Cognitive Status: Within Functional Limits for tasks assessed                                        Exercises      General Comments        Pertinent Vitals/Pain Pain Assessment: 0-10 Pain Score: 3  Pain Location: L knee Pain Descriptors / Indicators: Aching;Sore;Operative site guarding Pain Intervention(s): Monitored during session;Repositioned    Home Living  Prior Function            PT Goals (current goals can now be found in the care plan section) Progress towards PT goals: Progressing toward goals    Frequency    7X/week      PT Plan Current plan remains appropriate    Co-evaluation              AM-PAC PT "6 Clicks" Daily Activity  Outcome Measure  Difficulty turning over in bed (including adjusting bedclothes, sheets and blankets)?: A Little Difficulty moving from lying on back to sitting on the side of the bed? : A Little Difficulty sitting down on and standing up from a chair with arms (e.g., wheelchair, bedside commode, etc,.)?: A Little Help needed moving to and from a bed to chair (including a wheelchair)?: A Little Help needed walking in hospital room?: A Little Help needed climbing 3-5 steps with a railing? : A Lot 6 Click Score: 17    End of Session Equipment Utilized During Treatment: Gait belt Activity  Tolerance: Other (comment) Patient left: in bed   PT Visit Diagnosis: Difficulty in walking, not elsewhere classified (R26.2);Other abnormalities of gait and mobility (R26.89)     Time: 3419-3790 PT Time Calculation (min) (ACUTE ONLY): 28 min  Charges:  $Gait Training: 8-22 mins $Therapeutic Activity: 8-22 mins                    G Codes:       {Maevyn Riordan  PTA [facility]  Acute  Rehab Pager      [phone]

## 2018-01-23 NOTE — Progress Notes (Signed)
Preliminary notes--Bilateral carotid duplex exam completed. Bilateral vertebral arteries antegrade flow. Right ICA 1-39% stenosis. Left ICA distal 40-59% stenosis according to the category of velocity. Elevated velocity maybe also due to vessel tortuosity.   Hongying Brentlee Delage (RDMS RVT) 01/23/18 12:37 PM

## 2018-01-23 NOTE — Progress Notes (Signed)
Subjective: 4 Days Post-Op Procedure(s) (LRB): LEFT TOTAL KNEE ARTHROPLASTY (Left) Patient reports pain as mild in left knee. Moderate in left lateral hip and down LE. Patient seen in rounds with Dr. Wynelle Link. Patient is improving since her blood transfusion yesterday. Hip xray normal but patient reports continued pain over the left lateral hip and down to her foot. Has history of compression fracture in lumbar spine in 2014 with ongoing back issues since then. Denies SOB and chest pain. Denies dizziness this morning. Reports that she has an increased appetite today. Has some concerns about DC. Voiding. Positive flatus.  Plan is to go Home after hospital stay.  Objective: Vital signs in last 24 hours: Temp:  [98.5 F (36.9 C)-99.8 F (37.7 C)] 98.5 F (36.9 C) (05/10 0449) Pulse Rate:  [79-89] 82 (05/10 0449) Resp:  [16-20] 18 (05/10 0449) BP: (152-174)/(64-83) 167/83 (05/10 0449) SpO2:  [97 %-100 %] 97 % (05/10 0449)  Intake/Output from previous day:  Intake/Output Summary (Last 24 hours) at 01/23/2018 0739 Last data filed at 01/23/2018 0300 Gross per 24 hour  Intake 580 ml  Output 2025 ml  Net -1445 ml     Labs: Recent Labs    01/21/18 0558 01/22/18 0528 01/22/18 1600 01/23/18 0500  HGB 9.7* 8.6* 10.2* 10.4*   Recent Labs    01/22/18 0528 01/22/18 1600 01/23/18 0500  WBC 9.4  --  11.2*  RBC 2.60*  --  3.23*  HCT 25.6* 29.4* 30.6*  PLT 163  --  186   Recent Labs    01/22/18 0528 01/23/18 0500  NA 136 136  K 4.2 3.4*  CL 103 100*  CO2 24 24  BUN 9 7  CREATININE 0.55 0.51  GLUCOSE 99 116*  CALCIUM 8.4* 8.5*   Recent Labs    01/22/18 0528 01/23/18 0500  INR 2.06 2.19    EXAM General - Patient is Alert and Oriented Extremity - Neurologically intact Intact pulses distally Dorsiflexion/Plantar flexion intact Compartment soft Post op hematoma noted in left TKA. Erythema noted secondary to hematoma. No signs of infection.  Dressing/Incision -  clean, dry, no drainage Motor Function - intact, moving foot and toes well on exam. Difficulty with left leg SLR.   Past Medical History:  Diagnosis Date  . Cataract    Right Eye  . Chronic back pain   . Cubital tunnel syndrome    left  . Diverticulosis   . History of aortic valve disorder   . History of [facility] spotted fever   . History of subacute bacterial endocarditis   . Hyperlipidemia   . Osteoarthritis of both knees    and hands  . Osteopenia   . Skin cancer    Face    Assessment/Plan: 4 Days Post-Op Procedure(s) (LRB): LEFT TOTAL KNEE ARTHROPLASTY (Left) Principal Problem:   OA (osteoarthritis) of knee  Estimated body mass index is 20.32 kg/m as calculated from the following:   Height as of this encounter: 5' 5.5" (1.664 m).   Weight as of this encounter: 56.2 kg (124 lb). Advance diet Up with therapy Discharge home with home health when stable  DVT Prophylaxis - Lovenox and Coumadin Weight-Bearing as tolerated   Marissa Brown has had quite a complicated stay following her left TKA. She has not worked with therapy much as a result of her hypotension. She appears to be much better since her blood transfusion yesterday. Will have her get up with therapy today to walk and meet parameters for going  home. Left "hip" pain and LE pain appears to be secondary to underlying lumbar issues. Will see how she feels once she gets up with therapy. Hesitant to specifically medicate for low back issues at this time. Discussed very openly with the patient that given the discontinuation of the CPM due to confusion over foot drop, the inability to work with therapy because of hypotension, and the post op hematoma likely secondary to Coumadin therapy that we are going to have to work quite hard to get the knee moving. Will have her start HHPT upon DC home with PT/INR check on Sunday. Discontinue Lovenox bridge once >2.5 with coumadin 7.5. Will DC with Oxy Rx but stressed with the patient  that she should try to manage with tylenol alone to avoid drop in BP. Follow up in office on Tuesday 5/14 with Dr. Wynelle Link. Hopeful for DC home today or tomorrow pending medical stability and progress with therapy.   Ardeen Jourdain, PA-C Orthopaedic Surgery 01/23/2018, 7:39 AM

## 2018-01-23 NOTE — Progress Notes (Signed)
ANTICOAGULATION CONSULT NOTE - Follow Up Consult  Pharmacy Consult for warfarin Indication: mechanical Aortic valve, post-op L total knee arthroplasty  No Known Allergies  Patient Measurements: Height: 5' 5.5" (166.4 cm) Weight: 124 lb (56.2 kg) IBW/kg (Calculated) : 58.15 Heparin Dosing Weight:   Vital Signs: Temp: 98.5 F (36.9 C) (05/10 0449) Temp Source: Oral (05/10 0449) BP: 167/83 (05/10 0449) Pulse Rate: 82 (05/10 0449)  Labs: Recent Labs    01/21/18 0558 01/22/18 0528 01/22/18 1600 01/23/18 0500  HGB 9.7* 8.6* 10.2* 10.4*  HCT 29.1* 25.6* 29.4* 30.6*  PLT 167 163  --  186  LABPROT 16.3* 23.0*  --  24.1*  INR 1.32 2.06  --  2.19  CREATININE 0.67 0.55  --  0.51    Estimated Creatinine Clearance: 54.7 mL/min (by C-G formula based on SCr of 0.51 mg/dL).   Medications:  PTA warfarin regimen: 7.5 mg daily except 3.75mg  on TTSat  Assessment: Patient's a 77 y.o F with hx mechanical aortic valve on warfarin PTA, presented to [facility] on 01/19/18 for left TKA.  Warfarin resumed post-op.  Per [facility] Anti-coag clinic notes, "After discussion with Dr Donnetta Hutching, I called pt with update that she does not require lovenox bridge prior to surgery, she will start holding her warfarin on 01/13/18 until her surgery 01/19/18, and then after surgery she will take 1 extra tablet of warfarin [15 mg total dose] for 2 days then continue her usual dosing and see me 7 days later"  Significant Events: - 5/7 Lovenox 40mg  SQ daily ordered post-operatively in place of bridge to therapeutic INR - 5/9 patient experienced syncopal event overnight d/t anemia. TRH giving PRBC and increasing Lovenox to therapeutic dose per Rx consult.  Today, 01/23/2018: - INR is subtherapeutic but trending up with 2.19 today - cbc stable - no bleeding documented - being on abx can make pt more sensitive to warfarin (on ceftriaxone)  Goal of Therapy:  Goal INR 2.5-3.5 per [facility] anti-coag clinic notes LMWH Anti-Xa  level 0.6-1.0 drawn 4 hrs post-dose Monitor platelets by anticoagulation protocol: Yes   Plan:  - warfarin 7.5 mg PO x1 today - continue lovenox 60 mg SQ q12h until INR is within goal range - monitor for s/s bleeding  Tanveer Dobberstein P 01/23/2018,8:05 AM

## 2018-01-23 NOTE — Progress Notes (Signed)
Patient ID: Marissa Brown, female   DOB: 01/04/1941, 77 y.o.   MRN: [id number]  PROGRESS NOTE    Marissa Brown  UMP:[id number] DOB: 1940/10/29 DOA: 01/19/2018 PCP: Jonathon Resides, MD   Brief Narrative:  77 year old female with history of mechanical aortic valve on chronic warfarin, hypertension, dyslipidemia and left knee osteoarthritis underwent left total knee arthroplasty on 01/19/2018.  Hospitalist service was consulted on 01/22/2018 for orthostatic hypotension and dizziness and patient apparently had passed out.  She was started on intravenous fluids and antihypertensives were held.  Assessment & Plan:   Principal Problem:   OA (osteoarthritis) of knee   Syncope -Probable vasovagal.  Patient had a syncopal episode again today, had almost passed out but did not fall or hit her head, apparently had a brief questionable seizure-like activity -2D echo.  Carotid ultrasound.  EEG.  CAT scan of the brain -Fall precautions -Normal saline at 50 cc an hour -EKG reviewed by myself which shows chronic right bundle branch block  Acute blood loss anemia in a patient who recently had left knee arthroplasty -Status post 1 unit of packed red cell transfusion on 01/22/2018.  Hemoglobin stable.  Repeat a.m. hemoglobin.  No signs of GI blood loss  History of mechanical aortic valve on chronic warfarin -INR subtherapeutic.  Continue Lovenox and Coumadin to keep INR between 2.5-3.5.  Repeat a.m. INR  Left knee osteoarthritis status post left total knee arthroplasty -Care as per orthopedics.  PT evaluation  Leukocytosis -Probably reactive.  Repeat a.m. labs  Urinary tract infection has been ruled out -Discontinue Rocephin  Hypertension -Blood pressure on the higher side.  Will monitor.  Will probably resume antihypertensives tomorrow.  DVT prophylaxis: Lovenox and Coumadin Code Status: Full Family Communication: None at bedside Disposition Plan: Probable home in 1 to 2 days   Procedures: Left knee  total arthroplasty on 01/19/2018  Antimicrobials: Rocephin from 01/22/2018   Subjective: Patient was seen twice today at bedside.  Second time when I saw her, there was a rapid response going on and patient almost passed out and she was put back in bed.  She denies any nausea, vomiting.  Did not know what had happened.  She was shivering.  Objective: Vitals:   01/22/18 2131 01/22/18 2147 01/23/18 0449 01/23/18 1024  BP: (!) 160/72  (!) 167/83 (!) 156/79  Pulse: 89  82   Resp: 16  18   Temp:  99.8 F (37.7 C) 98.5 F (36.9 C)   TempSrc:  Oral Oral   SpO2: 98%  97%   Weight:      Height:        Intake/Output Summary (Last 24 hours) at 01/23/2018 1214 Last data filed at 01/23/2018 0300 Gross per 24 hour  Intake 700 ml  Output 1150 ml  Net -450 ml   Filed Weights   01/19/18 1052  Weight: 56.2 kg (124 lb)    Examination:  General exam: Appears in mild distress.  Awake and answers questions Respiratory system: Bilateral decreased breath sound at bases Cardiovascular system: S1 & S2 heard, rate controlled  gastrointestinal system: Abdomen is nondistended, soft and nontender. Normal bowel sounds heard. Central nervous system: Alert and oriented. No focal neurological deficits. Moving extremities.  No slurred speech.  Intermittent shivering Extremities: No cyanosis, clubbing, edema.  Left knee dressing present Skin: No rashes, lesions or ulcers Psychiatry: Looks anxious    Data Reviewed: I have personally reviewed following labs and imaging studies  CBC: Recent Labs  Lab 01/20/18 0558 01/21/18 0558 01/22/18 0528 01/22/18 1600 01/23/18 0500  WBC 11.6* 12.3* 9.4  --  11.2*  NEUTROABS  --   --   --   --  9.4*  HGB 10.5* 9.7* 8.6* 10.2* 10.4*  HCT 31.7* 29.1* 25.6* 29.4* 30.6*  MCV 98.1 98.6 98.5  --  94.7  PLT 171 167 163  --  458   Basic Metabolic Panel: Recent Labs  Lab 01/20/18 0558 01/21/18 0558 01/22/18 0528 01/23/18 0500  NA 137 136 136 136  K 4.7 4.2 4.2  3.4*  CL 105 104 103 100*  CO2 25 25 24 24   GLUCOSE 128* 110* 99 116*  BUN 11 13 9 7   CREATININE 0.60 0.67 0.55 0.51  CALCIUM 9.2 8.8* 8.4* 8.5*   GFR: Estimated Creatinine Clearance: 54.7 mL/min (by C-G formula based on SCr of 0.51 mg/dL). Liver Function Tests: Recent Labs  Lab 01/23/18 0500  AST 34  ALT 22  ALKPHOS 40  BILITOT 0.8  PROT 6.3*  ALBUMIN 3.3*   No results for input(s): LIPASE, AMYLASE in the last 168 hours. No results for input(s): AMMONIA in the last 168 hours. Coagulation Profile: Recent Labs  Lab 01/19/18 1600 01/20/18 0558 01/21/18 0558 01/22/18 0528 01/23/18 0500  INR 1.03 1.07 1.32 2.06 2.19   Cardiac Enzymes: No results for input(s): CKTOTAL, CKMB, CKMBINDEX, TROPONINI in the last 168 hours. BNP (last 3 results) No results for input(s): PROBNP in the last 8760 hours. HbA1C: No results for input(s): HGBA1C in the last 72 hours. CBG: Recent Labs  Lab 01/22/18 0225  GLUCAP 129*   Lipid Profile: No results for input(s): CHOL, HDL, LDLCALC, TRIG, CHOLHDL, LDLDIRECT in the last 72 hours. Thyroid Function Tests: No results for input(s): TSH, T4TOTAL, FREET4, T3FREE, THYROIDAB in the last 72 hours. Anemia Panel: No results for input(s): VITAMINB12, FOLATE, FERRITIN, TIBC, IRON, RETICCTPCT in the last 72 hours. Sepsis Labs: No results for input(s): PROCALCITON, LATICACIDVEN in the last 168 hours.  Recent Results (from the past 240 hour(s))  Surgical pcr screen     Status: None   Collection Time: 01/14/18  9:19 AM  Result Value Ref Range Status   MRSA, PCR NEGATIVE NEGATIVE Final   Staphylococcus aureus NEGATIVE NEGATIVE Final    Comment: (NOTE) The Xpert SA Assay (FDA approved for NASAL specimens in patients 73 years of age and older), is one component of a comprehensive surveillance program. It is not intended to diagnose infection nor to guide or monitor treatment. Performed at [facility], [place] [address], [place], [place] [address]          Radiology Studies: Dg Hip Port Unilat With Pelvis 1v Left  Result Date: 01/22/2018 CLINICAL DATA:  Acute left hip pain. EXAM: DG HIP (WITH OR WITHOUT PELVIS) 1V PORT LEFT COMPARISON:  None. FINDINGS: There is no evidence of hip fracture or dislocation. There is no evidence of arthropathy or other focal bone abnormality. IMPRESSION: Normal left hip. Electronically Signed   By: Marijo Conception, M.D.   On: 01/22/2018 11:41        Scheduled Meds: . docusate sodium  100 mg Oral BID  . enoxaparin (LOVENOX) injection  60 mg Subcutaneous Q12H  . pravastatin  40 mg Oral QHS  . warfarin  7.5 mg Oral ONCE-1800  . Warfarin - Pharmacist Dosing Inpatient   Does not apply q1800   Continuous Infusions: . sodium chloride 50 mL/hr at 01/23/18 1055     LOS:  4 days        Aline August, MD [facility] Pager [phone]  If 7PM-7AM, please contact night-coverage [email or web address] Password [email or web address] 01/23/2018, 12:14 PM

## 2018-01-23 NOTE — Progress Notes (Signed)
Emergency light pulled and arrived to room where PT had lowered pt to toilet.  Pt eyes were fixed forward and not responding to commands or voices.  Pt was lifted back to bed and began to respond to questions, etc.  Did no remember anything except ambulating to the BR with PT.  See PT note.  Dr Starla Link notified and present, Vitals WNL and telemetry did not report any abnormality.  Will continue to monitor.

## 2018-01-23 NOTE — Progress Notes (Signed)
EEG complete - results pending 

## 2018-01-23 NOTE — Progress Notes (Signed)
Spoke with pt concerning [place] needs. Pt had no prefrence for [facility] agencies. Pt is setup with [place] for HHRN/PT. Referral given to in house rep.

## 2018-01-23 NOTE — Progress Notes (Signed)
  Echocardiogram 2D Echocardiogram has been performed.  Merrie Roof F 01/23/2018, 3:44 PM

## 2018-01-23 NOTE — Progress Notes (Signed)
Patient ID: Marissa Brown, female   DOB: 1941/02/08, 77 y.o.   MRN: [id number] Was asked to see patient.  She is not having any significant knee pain and does have what sounds like left lower extremity sciatica  type pain.   Knee has an obvious intraarticular and periarticular hematoma but it is not causinf skin compromise nor pain. I believe her medical issues at this time are the most important issues. Continue current care and appreciate everyones help. Discussed with Amber she will talk with the medical team and see about some form of steroid for the sciatica.

## 2018-01-23 NOTE — Progress Notes (Signed)
Patient would like to talk with case Management again, patient wants to go home with PT treating the patient. A new case management consult was ordered.

## 2018-01-24 DIAGNOSIS — D72829 Elevated white blood cell count, unspecified: Secondary | ICD-10-CM

## 2018-01-24 LAB — CBC WITH DIFFERENTIAL/PLATELET
BASOS PCT: 0 %
Basophils Absolute: 0 10*3/uL (ref 0.0–0.1)
Eosinophils Absolute: 0 10*3/uL (ref 0.0–0.7)
Eosinophils Relative: 0 %
HCT: 26.1 % — ABNORMAL LOW (ref 36.0–46.0)
Hemoglobin: 8.9 g/dL — ABNORMAL LOW (ref 12.0–15.0)
LYMPHS ABS: 0.5 10*3/uL — AB (ref 0.7–4.0)
Lymphocytes Relative: 5 %
MCH: 32.2 pg (ref 26.0–34.0)
MCHC: 34.1 g/dL (ref 30.0–36.0)
MCV: 94.6 fL (ref 78.0–100.0)
MONOS PCT: 8 %
Monocytes Absolute: 0.9 10*3/uL (ref 0.1–1.0)
NEUTROS ABS: 10.3 10*3/uL — AB (ref 1.7–7.7)
NEUTROS PCT: 87 %
PLATELETS: 221 10*3/uL (ref 150–400)
RBC: 2.76 MIL/uL — ABNORMAL LOW (ref 3.87–5.11)
RDW: 14.5 % (ref 11.5–15.5)
WBC: 11.7 10*3/uL — AB (ref 4.0–10.5)

## 2018-01-24 LAB — COMPREHENSIVE METABOLIC PANEL
ALT: 20 U/L (ref 14–54)
ANION GAP: 10 (ref 5–15)
AST: 24 U/L (ref 15–41)
Albumin: 3.2 g/dL — ABNORMAL LOW (ref 3.5–5.0)
Alkaline Phosphatase: 36 U/L — ABNORMAL LOW (ref 38–126)
BUN: 8 mg/dL (ref 6–20)
CALCIUM: 8.7 mg/dL — AB (ref 8.9–10.3)
CHLORIDE: 100 mmol/L — AB (ref 101–111)
CO2: 24 mmol/L (ref 22–32)
Creatinine, Ser: 0.48 mg/dL (ref 0.44–1.00)
GFR calc non Af Amer: >60 mL/min (ref >60)
GLUCOSE: 138 mg/dL — AB (ref 65–99)
POTASSIUM: 4.1 mmol/L (ref 3.5–5.1)
SODIUM: 134 mmol/L — AB (ref 135–145)
Total Bilirubin: 0.8 mg/dL (ref 0.3–1.2)
Total Protein: 6.2 g/dL — ABNORMAL LOW (ref 6.5–8.1)

## 2018-01-24 LAB — URINE CULTURE: Culture: NO GROWTH

## 2018-01-24 LAB — MAGNESIUM: Magnesium: 2.1 mg/dL (ref 1.7–2.4)

## 2018-01-24 LAB — PROTIME-INR
INR: 3.01
PROTHROMBIN TIME: 31 s — AB (ref 11.4–15.2)

## 2018-01-24 MED ORDER — ACETAMINOPHEN 325 MG PO TABS
325.0000 mg | ORAL_TABLET | Freq: Four times a day (QID) | ORAL | Status: AC | PRN
  Administered 2018-01-24 – 2018-01-25 (×4): 650 mg via ORAL
  Filled 2018-01-24 (×5): qty 2

## 2018-01-24 MED ORDER — OXYCODONE HCL 5 MG PO TABS
5.0000 mg | ORAL_TABLET | Freq: Four times a day (QID) | ORAL | Status: DC | PRN
  Administered 2018-01-24 – 2018-01-27 (×2): 5 mg via ORAL
  Filled 2018-01-24 (×2): qty 1

## 2018-01-24 MED ORDER — WARFARIN SODIUM 4 MG PO TABS
4.0000 mg | ORAL_TABLET | Freq: Once | ORAL | Status: AC
Start: 2018-01-24 — End: 2018-01-24
  Administered 2018-01-24: 4 mg via ORAL
  Filled 2018-01-24: qty 1

## 2018-01-24 MED ORDER — METHOCARBAMOL 500 MG PO TABS
500.0000 mg | ORAL_TABLET | Freq: Four times a day (QID) | ORAL | Status: DC | PRN
  Administered 2018-01-24 – 2018-01-27 (×7): 500 mg via ORAL
  Filled 2018-01-24 (×7): qty 1

## 2018-01-24 MED ORDER — TRAMADOL HCL 50 MG PO TABS
50.0000 mg | ORAL_TABLET | Freq: Four times a day (QID) | ORAL | Status: DC | PRN
  Administered 2018-01-25 – 2018-01-26 (×6): 50 mg via ORAL
  Administered 2018-01-27: 100 mg via ORAL
  Filled 2018-01-24 (×2): qty 1
  Filled 2018-01-24: qty 2
  Filled 2018-01-24: qty 1
  Filled 2018-01-24: qty 2
  Filled 2018-01-24 (×2): qty 1

## 2018-01-24 NOTE — Progress Notes (Signed)
Attempted to get orthostatic vital signs.  Pt c/o feeling "swimmy headed" and her ears felt "stopped up".  Pt stood for approximately 1 minute before becoming non responsive, eyes fixed ahead.  Pt was lowered to the bed where she became responsive, following commands and oriented X4. Vital signs did show a drop in BP from lying to sitting.  Dr. Starla Link present for assessment.  Will continue to monitor.

## 2018-01-24 NOTE — Progress Notes (Signed)
Patient ID: Marissa Brown, female   DOB: [birth year], 77 y.o.   MRN: [id number]  PROGRESS NOTE    Marissa Brown  JJK:[id number] DOB: 16-Dec-1940 DOA: 01/19/2018 PCP: Jonathon Resides, MD   Brief Narrative:  77 year old female with history of mechanical aortic valve on chronic warfarin, hypertension, dyslipidemia and left knee osteoarthritis underwent left total knee arthroplasty on 01/19/2018.  Hospitalist service was consulted on 01/22/2018 for orthostatic hypotension and dizziness and patient apparently had passed out.  She was started on intravenous fluids and antihypertensives were held.  Assessment & Plan:   Principal Problem:   OA (osteoarthritis) of knee   Syncope -Probable vasovagal versus orthostatic -Patient had another syncopal episode today and was orthostatic. -2D echo, carotid ultrasound, EEG have remained unremarkable.  CT of the brain was negative for acute intracranial abnormality.  Patient does not have any focal neurologic deficit. -Compression stockings. -Patient might need rehab placement. consult Education officer, museum.  CIR consult -Spoke to Dr. McDowell/cardiology and he thinks that patient does not need outpatient Holter monitoring at this time as telemetry did not pick up any abnormalities.  Patient can follow-up with her regular cardiologist as an outpatient to decide regarding the need for same. -Fall precautions -EKG showed chronic right bundle branch block  Acute blood loss anemia in a patient who recently had left knee arthroplasty -Status post 1 unit of packed red cell transfusion on 01/22/2018.  Hemoglobin stable.  Repeat a.m. hemoglobin.  No signs of GI blood loss  History of mechanical aortic valve on chronic warfarin -INR therapeutic.  Discontinue Lovenox.  Continue Coumadin.  keep INR between 2.5-3.5.  Repeat a.m. INR  Left knee osteoarthritis status post left total knee arthroplasty -Care as per orthopedics.  PT evaluation  Leukocytosis -Probably reactive.     Urinary tract infection has been ruled out -Discontinued Rocephin  Hypertension -Blood pressure on the higher side.  Will monitor.  Will probably resume antihypertensives tomorrow if no longer orthostatic.  DVT prophylaxis: Lovenox and Coumadin Code Status: Full Family Communication: None at bedside Disposition Plan: Probable rehab placement  Procedures: Left knee total arthroplasty on 01/19/2018  Antimicrobials: Rocephin from 01/22/2018-01/23/2018   Subjective: Patient was seen at bedside. Patient had another syncopal episode today.  She was stood up and then after little while she almost passed out, she was put in bed.  She denies any overnight fever, nausea, vomiting.  Complains of lower back pain Objective: Vitals:   01/24/18 0844 01/24/18 0851 01/24/18 0856 01/24/18 0858  BP: (!) 185/64 134/75 (!) 128/116 (!) 166/60  Pulse: 89 (!) 101 92 83  Resp: 14 16    Temp:      TempSrc:      SpO2: 97% 98% 98% 98%  Weight:      Height:        Intake/Output Summary (Last 24 hours) at 01/24/2018 1300 Last data filed at 01/24/2018 0200 Gross per 24 hour  Intake 854.17 ml  Output -  Net 854.17 ml   Filed Weights   01/19/18 1052  Weight: 56.2 kg (124 lb)    Examination:  General exam: Awake and answers questions.  Looks anxious Respiratory system: Bilateral decreased breath sounds at bases  cardiovascular system: Rate controlled, S1-S2 positive  gastrointestinal system: Abdomen is nondistended, soft and nontender. Normal bowel sounds heard. Central nervous system: Alert and oriented. No focal neurological deficits. Moving extremities.   Extremities: No cyanosis, clubbing, edema.  Left knee dressing present     Data Reviewed:  I have personally reviewed following labs and imaging studies  CBC: Recent Labs  Lab 01/20/18 0558 01/21/18 0558 01/22/18 0528 01/22/18 1600 01/23/18 0500 01/24/18 0442  WBC 11.6* 12.3* 9.4  --  11.2* 11.7*  NEUTROABS  --   --   --   --  9.4*  10.3*  HGB 10.5* 9.7* 8.6* 10.2* 10.4* 8.9*  HCT 31.7* 29.1* 25.6* 29.4* 30.6* 26.1*  MCV 98.1 98.6 98.5  --  94.7 94.6  PLT 171 167 163  --  186 672   Basic Metabolic Panel: Recent Labs  Lab 01/20/18 0558 01/21/18 0558 01/22/18 0528 01/23/18 0500 01/24/18 0442  NA 137 136 136 136 134*  K 4.7 4.2 4.2 3.4* 4.1  CL 105 104 103 100* 100*  CO2 25 25 24 24 24   GLUCOSE 128* 110* 99 116* 138*  BUN 11 13 9 7 8   CREATININE 0.60 0.67 0.55 0.51 0.48  CALCIUM 9.2 8.8* 8.4* 8.5* 8.7*  MG  --   --   --   --  2.1   GFR: Estimated Creatinine Clearance: 54.7 mL/min (by C-G formula based on SCr of 0.48 mg/dL). Liver Function Tests: Recent Labs  Lab 01/23/18 0500 01/24/18 0442  AST 34 24  ALT 22 20  ALKPHOS 40 36*  BILITOT 0.8 0.8  PROT 6.3* 6.2*  ALBUMIN 3.3* 3.2*   No results for input(s): LIPASE, AMYLASE in the last 168 hours. No results for input(s): AMMONIA in the last 168 hours. Coagulation Profile: Recent Labs  Lab 01/20/18 0558 01/21/18 0558 01/22/18 0528 01/23/18 0500 01/24/18 0442  INR 1.07 1.32 2.06 2.19 3.01   Cardiac Enzymes: No results for input(s): CKTOTAL, CKMB, CKMBINDEX, TROPONINI in the last 168 hours. BNP (last 3 results) No results for input(s): PROBNP in the last 8760 hours. HbA1C: No results for input(s): HGBA1C in the last 72 hours. CBG: Recent Labs  Lab 01/22/18 0225  GLUCAP 129*   Lipid Profile: No results for input(s): CHOL, HDL, LDLCALC, TRIG, CHOLHDL, LDLDIRECT in the last 72 hours. Thyroid Function Tests: No results for input(s): TSH, T4TOTAL, FREET4, T3FREE, THYROIDAB in the last 72 hours. Anemia Panel: No results for input(s): VITAMINB12, FOLATE, FERRITIN, TIBC, IRON, RETICCTPCT in the last 72 hours. Sepsis Labs: No results for input(s): PROCALCITON, LATICACIDVEN in the last 168 hours.  Recent Results (from the past 240 hour(s))  Culture, Urine     Status: None   Collection Time: 01/22/18  9:32 AM  Result Value Ref Range Status    Specimen Description   Final    URINE, RANDOM Performed at [facility], [place] [address] [place], [place] [address]    Special Requests   Final    NONE Performed at [facility] [place] [address], [place], [place] [address]    Culture   Final    NO GROWTH Performed at [facility], [place] [address] [place], [place] [address]    Report Status 01/24/2018 FINAL  Final         Radiology Studies: Ct Head Wo Contrast  Result Date: 01/23/2018 CLINICAL DATA:  Altered mental status EXAM: CT HEAD WITHOUT CONTRAST TECHNIQUE: Contiguous axial images were obtained from the base of the skull through the vertex without intravenous contrast. COMPARISON:  None. FINDINGS: Brain: No mass lesion, intraparenchymal hemorrhage or extra-axial collection. No evidence of acute cortical infarct. Normal appearance of the brain parenchyma and extra axial spaces for age. Vascular: No hyperdense vessel or unexpected vascular calcification. Skull: Normal  visualized skull base, calvarium and extracranial soft tissues. Sinuses/Orbits: No sinus fluid levels or advanced mucosal thickening. No mastoid effusion. Normal orbits. IMPRESSION: Normal aging brain. Electronically Signed   By: Ulyses Jarred M.D.   On: 01/23/2018 16:52        Scheduled Meds: . docusate sodium  100 mg Oral BID  . pravastatin  40 mg Oral QHS  . predniSONE  10 mg Oral Nightly  . predniSONE  5 mg Oral 3 x daily with food  . [START ON 01/25/2018] predniSONE  5 mg Oral 4X daily taper  . warfarin  4 mg Oral ONCE-1800  . Warfarin - Pharmacist Dosing Inpatient   Does not apply q1800   Continuous Infusions: . sodium chloride 50 mL/hr at 01/24/18 0413     LOS: 5 days        Aline August, MD [facility] Pager [phone]  If 7PM-7AM, please contact night-coverage [email or web address] Password [email or web address] 01/24/2018, 1:00 PM

## 2018-01-24 NOTE — Progress Notes (Addendum)
Subjective: 5 Days Post-Op Procedure(s) (LRB): LEFT TOTAL KNEE ARTHROPLASTY (Left) Patient reports pain as mild.  Reports minimal knee pain.  Several syncopal type events have been noted. She has not been OOB yet at the time I had seen her this morning and was feeling well.  Objective: Vital signs in last 24 hours: Temp:  [98.1 F (36.7 C)-98.7 F (37.1 C)] 98.2 F (36.8 C) (05/11 0621) Pulse Rate:  [83-101] 83 (05/11 0858) Resp:  [12-18] 16 (05/11 0851) BP: (128-185)/(60-116) 166/60 (05/11 0858) SpO2:  [96 %-99 %] 98 % (05/11 0858)  Intake/Output from previous day: 05/10 0701 - 05/11 0700 In: 1094.2 [P.O.:340; I.V.:754.2] Out: -  Intake/Output this shift: No intake/output data recorded.  Recent Labs    01/22/18 0528 01/22/18 1600 01/23/18 0500 01/24/18 0442  HGB 8.6* 10.2* 10.4* 8.9*   Recent Labs    01/23/18 0500 01/24/18 0442  WBC 11.2* 11.7*  RBC 3.23* 2.76*  HCT 30.6* 26.1*  PLT 186 221   Recent Labs    01/23/18 0500 01/24/18 0442  NA 136 134*  K 3.4* 4.1  CL 100* 100*  CO2 24 24  BUN 7 8  CREATININE 0.51 0.48  GLUCOSE 116* 138*  CALCIUM 8.5* 8.7*   Recent Labs    01/23/18 0500 01/24/18 0442  INR 2.19 3.01    Neurologically intact ABD soft Neurovascular intact Sensation intact distally Intact pulses distally Dorsiflexion/Plantar flexion intact Incision: dressing C/D/I and scant drainage No cellulitis present Compartment soft no calf pain or sign of DVT  Anticipated LOS equal to or greater than 2 midnights due to - Age 9 and older with one or more of the following:  - Obesity  - Expected need for hospital services (PT, OT, Nursing) required for safe  discharge  - Anticipated need for postoperative skilled nursing care or inpatient rehab  - Active co-morbidities: None OR   - Unanticipated findings during/Post Surgery: Lab abnormalities, Slow post-op progression: GI, pain control, mobility and syncopal episodesm, hypotension     Assessment/Plan: 5 Days Post-Op Procedure(s) (LRB): LEFT TOTAL KNEE ARTHROPLASTY (Left) Advance diet Up with therapy  Discussed with Dr. Tonita Cong. Discussed with Dr. Starla Link this morning. EEG, EKG, Echo have been done. Workup relatively benign. Discussed trying to mobilize this AM but according to nurses notes after I saw patient she had another similar epidose when standing for orthostatic vitals. Dr. Starla Link is aware She is stable from an ortho standpoint. We will continue to follow while she is here.    BISSELL, JACLYN M. 01/24/2018, 10:12 AM   [phone] cell

## 2018-01-24 NOTE — Progress Notes (Signed)
ANTICOAGULATION CONSULT NOTE - Follow Up Consult  Pharmacy Consult for warfarin Indication: mechanical Aortic valve, post-op L total knee arthroplasty  No Known Allergies  Patient Measurements: Height: 5' 5.5" (166.4 cm) Weight: 124 lb (56.2 kg) IBW/kg (Calculated) : 58.15 Heparin Dosing Weight:   Vital Signs: Temp: 98.2 F (36.8 C) (05/11 0621) Temp Source: Oral (05/11 0621) BP: 185/64 (05/11 0844) Pulse Rate: 89 (05/11 0844)  Labs: Recent Labs    01/22/18 0528 01/22/18 1600 01/23/18 0500 01/24/18 0442  HGB 8.6* 10.2* 10.4* 8.9*  HCT 25.6* 29.4* 30.6* 26.1*  PLT 163  --  186 221  LABPROT 23.0*  --  24.1* 31.0*  INR 2.06  --  2.19 3.01  CREATININE 0.55  --  0.51 0.48    Estimated Creatinine Clearance: 54.7 mL/min (by C-G formula based on SCr of 0.48 mg/dL).   Medications:  PTA warfarin regimen: 7.5 mg daily except 3.75mg  on TTSat  Assessment: Patient's a 77 y.o F with hx mechanical aortic valve on warfarin PTA, presented to [facility] on 01/19/18 for left TKA.  Warfarin resumed post-op.  Per [facility] notes, "After discussion with Dr Donnetta Hutching, I called pt with update that she does not require lovenox bridge prior to surgery, she will start holding her warfarin on 01/13/18 until her surgery 01/19/18, and then after surgery she will take 1 extra tablet of warfarin [15 mg total dose] for 2 days then continue her usual dosing and see me 7 days later"  Significant Events: - 5/7 Lovenox 40mg  SQ daily ordered post-operatively in place of bridge to therapeutic INR - 5/9 patient experienced syncopal event overnight d/t anemia. TRH giving PRBC and increasing Lovenox to therapeutic dose per Rx consult. 5/11: D/C lovenox due to therapeutic INR  Today, 01/24/2018: - INR now therapeutic - cbc stable - no bleeding documented - being on abx can make pt more sensitive to warfarin (on ceftriaxone)  Goal of Therapy:  Goal INR 2.5-3.5 per [facility] anti-coag clinic notes Monitor  platelets by anticoagulation protocol: Yes   Plan:  - Warfarin 4mg  PO x1 today - Lovenox d/c'd due to therapeutic INR - Monitor for s/s bleeding   Adrian Saran, PharmD, BCPS Pager [phone] 01/24/2018 8:51 AM

## 2018-01-24 NOTE — Procedures (Signed)
Date of recording 01/23/2018  Referring physician Dr. Cheral Marker  Reason for the study Syncope  Technical Digital EEG recording using 10-20 electrode international system.  Description of the recording When awake posterior dominant rhythm is 8 to 9 Hz symmetrical reactive and was sustained. Photic stimulation did not produce any abnormal response. Non-REM stage II sleep was obtained vertex transients and sleep spindles were seen. No localizing, lateralizing, interictal epileptiform features were seen during this recording.  Impression The EEG is normal with patient recording awake and sleep states

## 2018-01-24 NOTE — Progress Notes (Signed)
PT Cancellation Note  Patient Details Name: PROMISE WELDIN MRN: [id number] DOB: 25-Mar-1941   Cancelled Treatment:    Reason Eval/Treat Not Completed: Medical issues which prohibited therapy PT speaking with nurse. Nurse stating syncope type episode while assessing orthostatics this AM. Advised to hold therapy for today.    Lanney Gins, PT, DPT 01/24/18 10:37 AM

## 2018-01-25 LAB — BASIC METABOLIC PANEL
Anion gap: 11 (ref 5–15)
BUN: 11 mg/dL (ref 6–20)
CO2: 23 mmol/L (ref 22–32)
CREATININE: 0.48 mg/dL (ref 0.44–1.00)
Calcium: 8.4 mg/dL — ABNORMAL LOW (ref 8.9–10.3)
Chloride: 97 mmol/L — ABNORMAL LOW (ref 101–111)
GFR calc Af Amer: >60 mL/min (ref >60)
GLUCOSE: 116 mg/dL — AB (ref 65–99)
POTASSIUM: 4.2 mmol/L (ref 3.5–5.1)
Sodium: 131 mmol/L — ABNORMAL LOW (ref 135–145)

## 2018-01-25 LAB — CBC WITH DIFFERENTIAL/PLATELET
Basophils Absolute: 0 10*3/uL (ref 0.0–0.1)
Basophils Relative: 0 %
EOS PCT: 0 %
Eosinophils Absolute: 0 10*3/uL (ref 0.0–0.7)
HCT: 23.5 % — ABNORMAL LOW (ref 36.0–46.0)
Hemoglobin: 8 g/dL — ABNORMAL LOW (ref 12.0–15.0)
LYMPHS ABS: 1 10*3/uL (ref 0.7–4.0)
LYMPHS PCT: 9 %
MCH: 32.7 pg (ref 26.0–34.0)
MCHC: 34 g/dL (ref 30.0–36.0)
MCV: 95.9 fL (ref 78.0–100.0)
MONOS PCT: 12 %
Monocytes Absolute: 1.4 10*3/uL — ABNORMAL HIGH (ref 0.1–1.0)
Neutro Abs: 9.6 10*3/uL — ABNORMAL HIGH (ref 1.7–7.7)
Neutrophils Relative %: 79 %
PLATELETS: 224 10*3/uL (ref 150–400)
RBC: 2.45 MIL/uL — ABNORMAL LOW (ref 3.87–5.11)
RDW: 14.3 % (ref 11.5–15.5)
WBC: 12.1 10*3/uL — ABNORMAL HIGH (ref 4.0–10.5)

## 2018-01-25 LAB — PROTIME-INR
INR: 3.74
Prothrombin Time: 36.7 seconds — ABNORMAL HIGH (ref 11.4–15.2)

## 2018-01-25 LAB — MAGNESIUM: MAGNESIUM: 2.1 mg/dL (ref 1.7–2.4)

## 2018-01-25 MED ORDER — MIDODRINE HCL 2.5 MG PO TABS
2.5000 mg | ORAL_TABLET | Freq: Three times a day (TID) | ORAL | Status: DC
  Administered 2018-01-25 – 2018-01-27 (×7): 2.5 mg via ORAL
  Filled 2018-01-25 (×8): qty 1

## 2018-01-25 NOTE — Progress Notes (Signed)
Physical Therapy Treatment Patient Details Name: Marissa Brown MRN: [id number] DOB: 11/08/1940 Today's Date: 01/25/2018    History of Present Illness 77 yo female s/p L TKA 01/19/18, also presents with L sciatica pain    PT Comments    RN reports drop in BP this morning with orthostatics and pt symptomatic.  Will defer mobility today however pt agreeable to perform LE exercises.  Pt performed LE exercises in supine as tolerated.  Plan per chart is for d/c to SNF.   Follow Up Recommendations  Follow surgeon's recommendation for DC plan and follow-up therapies     Equipment Recommendations  Rolling walker with 5" wheels;3in1 (PT)    Recommendations for Other Services       Precautions / Restrictions Precautions Precautions: Fall;Knee Required Braces or Orthoses: Knee Immobilizer - Left Knee Immobilizer - Left: Discontinue once straight leg raise with < 10 degree lag Restrictions Other Position/Activity Restrictions: WBAT    Mobility  Bed Mobility                  Transfers                    Ambulation/Gait                 Stairs             Wheelchair Mobility    Modified Rankin (Stroke Patients Only)       Balance                                            Cognition Arousal/Alertness: Awake/alert Behavior During Therapy: WFL for tasks assessed/performed Overall Cognitive Status: Within Functional Limits for tasks assessed                                        Exercises Total Joint Exercises Ankle Circles/Pumps: AROM;10 reps;Supine;Both Quad Sets: AROM;Both;10 reps;Supine Short Arc Quad: AAROM;10 reps;Left Heel Slides: AAROM;Left;Supine;10 reps Hip ABduction/ADduction: AAROM;Left;10 reps;Supine Goniometric ROM: approximately 40* AAROM limited by pain; pt also reports limited knee flexion prior to surgery    General Comments        Pertinent Vitals/Pain Pain Assessment: 0-10 Pain  Score: 4  Pain Location: L knee and buttock/hip Pain Descriptors / Indicators: Aching;Sore Pain Intervention(s): Limited activity within patient's tolerance;Repositioned;Monitored during session;Ice applied;Heat applied(heat for sciatica pain, ice to knee)    Home Living                      Prior Function            PT Goals (current goals can now be found in the care plan section) Progress towards PT goals: Not progressing toward goals - comment(mobility limited by orthostatics/dizziness at this time)    Frequency    7X/week      PT Plan Current plan remains appropriate    Co-evaluation              AM-PAC PT "6 Clicks" Daily Activity  Outcome Measure  Difficulty turning over in bed (including adjusting bedclothes, sheets and blankets)?: A Little Difficulty moving from lying on back to sitting on the side of the bed? : A Little Difficulty sitting down on and standing up from a chair with arms (  e.g., wheelchair, bedside commode, etc,.)?: A Little Help needed moving to and from a bed to chair (including a wheelchair)?: A Lot Help needed walking in hospital room?: A Lot Help needed climbing 3-5 steps with a railing? : A Lot 6 Click Score: 15    End of Session Equipment Utilized During Treatment: Gait belt Activity Tolerance: Treatment limited secondary to medical complications (Comment)(positive orthostatics this morning) Patient left: in bed;with call bell/phone within reach   PT Visit Diagnosis: Difficulty in walking, not elsewhere classified (R26.2);Pain Pain - Right/Left: Left Pain - part of body: Knee     Time: 4562-5638 PT Time Calculation (min) (ACUTE ONLY): 22 min  Charges:  $Therapeutic Exercise: 8-22 mins                    G Codes:       Carmelia Bake, PT, DPT 01/25/2018 Pager: [phone] [place] Ram E 01/25/2018, 1:35 PM

## 2018-01-25 NOTE — NC FL2 (Signed)
[place] LEVEL OF CARE SCREENING TOOL     IDENTIFICATION  Patient Name: Marissa Brown Birthdate: 1941-09-14 Sex: female Admission Date (Current Location): 01/19/2018  [facility] and [place] Number:  Herbalist and Address:  [facility], [place] [address], [place], [place] [address]      Provider Number: [id number]  Attending Physician Name and Address:  Aline August, MD  Relative Name and Phone Number:       Current Level of Care: Hospital Recommended Level of Care: [facility] Prior Approval Number:    Date Approved/Denied:   PASRR Number: [id number]  Discharge Plan: SNF    Current Diagnoses: Patient Active Problem List   Diagnosis Date Noted  . OA (osteoarthritis) of knee 01/19/2018  . Compression fracture of thoracic vertebra (HCC) 02/13/2013  . Back pain, thoracic 02/13/2013    Orientation RESPIRATION BLADDER Height & Weight     Self, Time, Situation, Place  Normal Continent Weight: 124 lb (56.2 kg) Height:  5' 5.5" (166.4 cm)  BEHAVIORAL SYMPTOMS/MOOD NEUROLOGICAL BOWEL NUTRITION STATUS      Continent Diet(regular)  AMBULATORY STATUS COMMUNICATION OF NEEDS Skin   Extensive Assist Verbally Surgical wounds(on knee- hydrocolloid)                       Personal Care Assistance Level of Assistance  Bathing, Dressing Bathing Assistance: Maximum assistance   Dressing Assistance: Maximum assistance     Functional Limitations Info             SPECIAL CARE FACTORS FREQUENCY  PT (By licensed PT), OT (By licensed OT)     PT Frequency: 5/wk OT Frequency: 5/wk            Contractures      Additional Factors Info  Code Status, Allergies Code Status Info: FULL Allergies Info: NKA           Current Medications (01/25/2018):  This is the current hospital active medication list Current Facility-Administered Medications  Medication Dose Route Frequency Provider Last Rate Last Dose  .  bisacodyl (DULCOLAX) suppository 10 mg  10 mg Rectal Daily PRN Nita Sells, MD   10 mg at 01/24/18 0451  . diphenhydrAMINE (BENADRYL) 12.5 MG/5ML elixir 12.5-25 mg  12.5-25 mg Oral Q4H PRN Nita Sells, MD      . docusate sodium (COLACE) capsule 100 mg  100 mg Oral BID Nita Sells, MD   100 mg at 01/25/18 0908  . hydrALAZINE (APRESOLINE) injection 5 mg  5 mg Intravenous Q4H PRN Nita Sells, MD   5 mg at 01/22/18 1410  . menthol-cetylpyridinium (CEPACOL) lozenge 3 mg  1 lozenge Oral PRN Nita Sells, MD       Or  . phenol (CHLORASEPTIC) mouth spray 1 spray  1 spray Mouth/Throat PRN Nita Sells, MD      . methocarbamol (ROBAXIN) tablet 500 mg  500 mg Oral Q6H PRN Aline August, MD   500 mg at 01/25/18 0409  . metoCLOPramide (REGLAN) tablet 5-10 mg  5-10 mg Oral Q8H PRN Nita Sells, MD       Or  . metoCLOPramide (REGLAN) injection 5-10 mg  5-10 mg Intravenous Q8H PRN Nita Sells, MD   10 mg at 01/21/18 1806  . midodrine (PROAMATINE) tablet 2.5 mg  2.5 mg Oral TID WC Alekh, Kshitiz, MD      . ondansetron (ZOFRAN) tablet 4 mg  4 mg Oral Q6H PRN Nita Sells, MD  Or  . ondansetron (ZOFRAN) injection 4 mg  4 mg Intravenous Q6H PRN Nita Sells, MD   4 mg at 01/22/18 2218  . oxyCODONE (Oxy IR/ROXICODONE) immediate release tablet 5 mg  5 mg Oral Q6H PRN Aline August, MD   5 mg at 01/24/18 2058  . polyethylene glycol (MIRALAX / GLYCOLAX) packet 17 g  17 g Oral Daily PRN Nita Sells, MD   17 g at 01/24/18 0451  . pravastatin (PRAVACHOL) tablet 40 mg  40 mg Oral QHS Nita Sells, MD   40 mg at 01/24/18 2058  . predniSONE (STERAPRED UNI-PAK 21 TAB) tablet 5 mg  5 mg Oral 4X daily taper Cecilio Asper, Amber, PA-C   5 mg at 01/25/18 0907  . sodium phosphate (FLEET) 7-19 GM/118ML enema 1 enema  1 enema Rectal Once PRN Nita Sells, MD      . traMADol Veatrice Bourbon) tablet 50-100 mg  50-100 mg Oral Q6H PRN  Aline August, MD   50 mg at 01/25/18 0938  . Warfarin - Pharmacist Dosing Inpatient   Does not apply [id number] Nita Sells, MD         Discharge Medications: Please see discharge summary for a list of discharge medications.  Relevant Imaging Results:  Relevant Lab Results:   Additional Information SS#: [id number]  Jorge Ny, LCSW

## 2018-01-25 NOTE — Progress Notes (Signed)
Patient ID: Marissa Brown, female   DOB: 02-Jun-1941, 77 y.o.   MRN: [id number]  PROGRESS NOTE    Marissa Brown  HKV:[id number] DOB: 1941-07-07 DOA: 01/19/2018 PCP: Jonathon Resides, MD   Brief Narrative:  76 year old female with history of mechanical aortic valve on chronic warfarin, hypertension, dyslipidemia and left knee osteoarthritis underwent left total knee arthroplasty on 01/19/2018.  Hospitalist service was consulted on 01/22/2018 for orthostatic hypotension and dizziness and patient apparently had passed out.  She was started on intravenous fluids and antihypertensives were held.  Assessment & Plan:   Principal Problem:   OA (osteoarthritis) of knee   Syncope -Probable vasovagal versus orthostatic -Check orthostatic vitals today again.  Continue physical therapy. -2D echo, carotid ultrasound, EEG have remained unremarkable.  CT of the brain was negative for acute intracranial abnormality.  Patient does not have any focal neurologic deficit. -Compression stockings. -Patient might need rehab placement.  Social worker/CIR consulted. -Spoke to Dr. McDowell/cardiology on 01/24/2018 and he thinks that patient does not need outpatient Holter monitoring at this time as telemetry did not pick up any abnormalities.  Patient can follow-up with her regular cardiologist as an outpatient to decide regarding the need for same. -Fall precautions -EKG showed chronic right bundle branch block  Acute blood loss anemia in a patient who recently had left knee arthroplasty -Status post 1 unit of packed red cell transfusion on 01/22/2018.  Hemoglobin stable.  Repeat a.m. hemoglobin.  No signs of GI blood loss  History of mechanical aortic valve on chronic warfarin -INR therapeutic.  Discontinue Lovenox.  Continue Coumadin.  keep INR between 2.5-3.5.  Repeat a.m. INR  Left knee osteoarthritis status post left total knee arthroplasty -Care as per orthopedics.  PT evaluation -Pain management as per  orthopedics.  Leukocytosis -Probably reactive.    Urinary tract infection has been ruled out -Discontinued Rocephin  Hypertension -Blood pressure stable for now.  Will monitor.  Antihypertensives on hold  DVT prophylaxis: Lovenox and Coumadin Code Status: Full Family Communication: None at bedside Disposition Plan: Probable rehab placement  Procedures: Left knee total arthroplasty on 01/19/2018  Antimicrobials: Rocephin from 01/22/2018-01/23/2018   Subjective: Patient was seen at bedside.  Patient denies any overnight fever, nausea, vomiting, shortness of breath, chest pain.  She feels slightly better and slept better last night. Objective: Vitals:   01/25/18 0913 01/25/18 0916 01/25/18 0917 01/25/18 0921  BP: (!) 138/58 (!) 146/62 (!) 96/48 (!) 147/55  Pulse: 94 (!) 120 (!) 118 97  Resp:      Temp:      TempSrc:      SpO2: 100% 100%    Weight:      Height:        Intake/Output Summary (Last 24 hours) at 01/25/2018 0923 Last data filed at 01/25/2018 0831 Gross per 24 hour  Intake 1200 ml  Output 400 ml  Net 800 ml   Filed Weights   01/19/18 1052  Weight: 56.2 kg (124 lb)    Examination:  General exam: Awake and answers questions.  No acute distress  respiratory system: Bilateral decreased breath sounds at bases cardiovascular system: S1-S2 heard, rate controlled  gastrointestinal system: Abdomen is nondistended, soft and nontender. Normal bowel sounds heard. Extremities: No cyanosis, clubbing, edema.  Left knee dressing present     Data Reviewed: I have personally reviewed following labs and imaging studies  CBC: Recent Labs  Lab 01/21/18 0558 01/22/18 0528 01/22/18 1600 01/23/18 0500 01/24/18 0442 01/25/18 0451  WBC 12.3* 9.4  --  11.2* 11.7* 12.1*  NEUTROABS  --   --   --  9.4* 10.3* 9.6*  HGB 9.7* 8.6* 10.2* 10.4* 8.9* 8.0*  HCT 29.1* 25.6* 29.4* 30.6* 26.1* 23.5*  MCV 98.6 98.5  --  94.7 94.6 95.9  PLT 167 163  --  186 221 630   Basic  Metabolic Panel: Recent Labs  Lab 01/21/18 0558 01/22/18 0528 01/23/18 0500 01/24/18 0442 01/25/18 0451  NA 136 136 136 134* 131*  K 4.2 4.2 3.4* 4.1 4.2  CL 104 103 100* 100* 97*  CO2 25 24 24 24 23   GLUCOSE 110* 99 116* 138* 116*  BUN 13 9 7 8 11   CREATININE 0.67 0.55 0.51 0.48 0.48  CALCIUM 8.8* 8.4* 8.5* 8.7* 8.4*  MG  --   --   --  2.1 2.1   GFR: Estimated Creatinine Clearance: 54.7 mL/min (by C-G formula based on SCr of 0.48 mg/dL). Liver Function Tests: Recent Labs  Lab 01/23/18 0500 01/24/18 0442  AST 34 24  ALT 22 20  ALKPHOS 40 36*  BILITOT 0.8 0.8  PROT 6.3* 6.2*  ALBUMIN 3.3* 3.2*   No results for input(s): LIPASE, AMYLASE in the last 168 hours. No results for input(s): AMMONIA in the last 168 hours. Coagulation Profile: Recent Labs  Lab 01/21/18 0558 01/22/18 0528 01/23/18 0500 01/24/18 0442 01/25/18 0451  INR 1.32 2.06 2.19 3.01 3.74   Cardiac Enzymes: No results for input(s): CKTOTAL, CKMB, CKMBINDEX, TROPONINI in the last 168 hours. BNP (last 3 results) No results for input(s): PROBNP in the last 8760 hours. HbA1C: No results for input(s): HGBA1C in the last 72 hours. CBG: Recent Labs  Lab 01/22/18 0225  GLUCAP 129*   Lipid Profile: No results for input(s): CHOL, HDL, LDLCALC, TRIG, CHOLHDL, LDLDIRECT in the last 72 hours. Thyroid Function Tests: No results for input(s): TSH, T4TOTAL, FREET4, T3FREE, THYROIDAB in the last 72 hours. Anemia Panel: No results for input(s): VITAMINB12, FOLATE, FERRITIN, TIBC, IRON, RETICCTPCT in the last 72 hours. Sepsis Labs: No results for input(s): PROCALCITON, LATICACIDVEN in the last 168 hours.  Recent Results (from the past 240 hour(s))  Culture, Urine     Status: None   Collection Time: 01/22/18  9:32 AM  Result Value Ref Range Status   Specimen Description   Final    URINE, RANDOM Performed at [facility], [place] [address] [place], [place] [address]    Special Requests    Final    NONE Performed at [facility], [place] [address] [place], [place] [address]    Culture   Final    NO GROWTH Performed at [facility], [place] [address], [place], [place] [address]    Report Status 01/24/2018 FINAL  Final         Radiology Studies: Ct Head Wo Contrast  Result Date: 01/23/2018 CLINICAL DATA:  Altered mental status EXAM: CT HEAD WITHOUT CONTRAST TECHNIQUE: Contiguous axial images were obtained from the base of the skull through the vertex without intravenous contrast. COMPARISON:  None. FINDINGS: Brain: No mass lesion, intraparenchymal hemorrhage or extra-axial collection. No evidence of acute cortical infarct. Normal appearance of the brain parenchyma and extra axial spaces for age. Vascular: No hyperdense vessel or unexpected vascular calcification. Skull: Normal visualized skull base, calvarium and extracranial soft tissues. Sinuses/Orbits: No sinus fluid levels or advanced mucosal thickening. No mastoid effusion. Normal orbits. IMPRESSION: Normal aging brain. Electronically Signed   By: Lennette Bihari  [place] M.D.   On: 01/23/2018 16:52        Scheduled Meds: . docusate sodium  100 mg Oral BID  . pravastatin  40 mg Oral QHS  . predniSONE  5 mg Oral 4X daily taper  . Warfarin - Pharmacist Dosing Inpatient   Does not apply q1800   Continuous Infusions:    LOS: 6 days        Aline August, MD [facility] Pager [phone]  If 7PM-7AM, please contact night-coverage [email or web address] Password [facility] 01/25/2018, 9:23 AM

## 2018-01-25 NOTE — Progress Notes (Signed)
Orthostatic VS completed and documented in flowsheet. With standing pt stated that her ears started to "ring" like they did previous with the other episodes per patient. Pt assisted back to bed. Will continue to monitor.

## 2018-01-25 NOTE — Progress Notes (Signed)
Subjective: 6 Days Post-Op Procedure(s) (LRB): LEFT TOTAL KNEE ARTHROPLASTY (Left) Patient reports pain as mild to knee. Reports sciatica type pain that has improved with prednisone. Tolerating PO's. Limited PT.    Objective: Vital signs in last 24 hours: Temp:  [98.2 F (36.8 C)-99.6 F (37.6 C)] 98.7 F (37.1 C) (05/12 0530) Pulse Rate:  [78-101] 85 (05/12 0530) Resp:  [14-18] 16 (05/12 0530) BP: (128-185)/(58-116) 154/81 (05/12 0530) SpO2:  [91 %-98 %] 96 % (05/12 0530)  Intake/Output from previous day: 05/11 0701 - 05/12 0700 In: 1200 [P.O.:1200] Out: 200 [Urine:200] Intake/Output this shift: No intake/output data recorded.  Recent Labs    01/22/18 1600 01/23/18 0500 01/24/18 0442 01/25/18 0451  HGB 10.2* 10.4* 8.9* 8.0*   Recent Labs    01/24/18 0442 01/25/18 0451  WBC 11.7* 12.1*  RBC 2.76* 2.45*  HCT 26.1* 23.5*  PLT 221 224   Recent Labs    01/24/18 0442 01/25/18 0451  NA 134* 131*  K 4.1 4.2  CL 100* 97*  CO2 24 23  BUN 8 11  CREATININE 0.48 0.48  GLUCOSE 138* 116*  CALCIUM 8.7* 8.4*   Recent Labs    01/24/18 0442 01/25/18 0451  INR 3.01 3.74    Alert and oriented x3. RRR, Lungs clear, BS x4. Left Calf soft and non tender. L knee dressing C/D/I. No DVT signs. No signs of infection or compartment syndrome. LLE grossly neurovascularly intact.   Anticipated LOS equal to or greater than 2 midnights due to - Age 24 and older with one or more of the following:  - Obesity  - Expected need for hospital services (PT, OT, Nursing) required for safe  discharge  - Anticipated need for postoperative skilled nursing care or inpatient rehab  - Active co-morbidities: None OR   - Unanticipated findings during/Post Surgery: None  - Patient is a high risk of re-admission due to: None   Assessment/Plan: 6 Days Post-Op Procedure(s) (LRB): LEFT TOTAL KNEE ARTHROPLASTY (Left) Up with therapy Plan for discharge tomorrow  Dressing changed Medicine  following  Left sciatica:  Improved with Prednisone       Ashaad Gaertner L 01/25/2018, 8:03 AM

## 2018-01-25 NOTE — Clinical Social Work Note (Signed)
Clinical Social Work Assessment  Patient Details  Name: Marissa Brown MRN: [id number] Date of Birth: 29-Mar-1941  Date of referral:  01/25/18               Reason for consult:  Facility Placement                Permission sought to share information with:  Facility Art therapist granted to share information::  Yes, Verbal Permission Granted  Name::        Agency::  SNF  Relationship::     Contact Information:     Housing/Transportation Living arrangements for the past 2 months:  Single Family Home Source of Information:  Patient Patient Interpreter Needed:  None Criminal Activity/Legal Involvement Pertinent to Current Situation/Hospitalization:  No - Comment as needed Significant Relationships:  Spouse Lives with:  Spouse Do you feel safe going back to the place where you live?  Yes Need for family participation in patient care:  No (Coment)  Care giving concerns:  Pt lives at home with spouse.  Spouse has back problems and cancer- would not be able to assist medically.   Social Worker assessment / plan:  CSW spoke with pt concerning PT recommendation for SNF at this time.  CSW explained SNF and SNF referral process.  Pt has known multiple friends that have required placement so is familiar with local options.  Employment status:  Retired Nurse, adult PT Recommendations:  [place] / Referral to community resources:  [place]  Patient/Family's Response to care:  Pt agreeable to SNF at this time- understands that her needs could not be met at home.  Patient/Family's Understanding of and Emotional Response to Diagnosis, Current Treatment, and Prognosis:  Pt very realistic about her needs- hopeful that rehab will be very short term then she can return home with her spouse.  Emotional Assessment Appearance:  Appears stated age Attitude/Demeanor/Rapport:    Affect (typically observed):   Appropriate Orientation:  Oriented to Self, Oriented to Place, Oriented to Situation, Oriented to  Time Alcohol / Substance use:  Not Applicable Psych involvement (Current and /or in the community):  No (Comment)  Discharge Needs  Concerns to be addressed:  Care Coordination Readmission within the last 30 days:  No Current discharge risk:  Physical Impairment Barriers to Discharge:  Continued Medical Work up   Jorge Ny, LCSW 01/25/2018, 12:10 PM

## 2018-01-25 NOTE — Progress Notes (Signed)
ANTICOAGULATION CONSULT NOTE - Follow Up Consult  Pharmacy Consult for warfarin Indication: mechanical Aortic valve, post-op L total knee arthroplasty  No Known Allergies  Patient Measurements: Height: 5' 5.5" (166.4 cm) Weight: 124 lb (56.2 kg) IBW/kg (Calculated) : 58.15   Vital Signs: Temp: 98.7 F (37.1 C) (05/12 0530) Temp Source: Oral (05/12 0530) BP: 154/81 (05/12 0530) Pulse Rate: 85 (05/12 0530)  Labs: Recent Labs    01/23/18 0500 01/24/18 0442 01/25/18 0451  HGB 10.4* 8.9* 8.0*  HCT 30.6* 26.1* 23.5*  PLT 186 221 224  LABPROT 24.1* 31.0* 36.7*  INR 2.19 3.01 3.74  CREATININE 0.51 0.48 0.48    Estimated Creatinine Clearance: 54.7 mL/min (by C-G formula based on SCr of 0.48 mg/dL).   Medications:  PTA warfarin regimen: 7.5 mg daily except 3.75mg  on TTSat  Assessment: Patient's a 77 y.o F with hx mechanical aortic valve on warfarin PTA, presented to [facility] on 01/19/18 for left TKA.  Warfarin resumed post-op.  Per [facility] Anti-coag clinic notes, "After discussion with Dr Donnetta Hutching, I called pt with update that she does not require lovenox bridge prior to surgery, she will start holding her warfarin on 01/13/18 until her surgery 01/19/18, and then after surgery she will take 1 extra tablet of warfarin [15 mg total dose] for 2 days then continue her usual dosing and see me 7 days later"  Significant Events: - 5/7 Lovenox 40mg  SQ daily ordered post-operatively in place of bridge to therapeutic INR - 5/9 patient experienced syncopal event overnight d/t anemia. TRH giving PRBC and increasing Lovenox to therapeutic dose per Rx consult. 5/11: D/C lovenox due to therapeutic INR  Today, 01/25/2018: - INR continues to rise, now supratherapeutic - Hgb 8 trending downward, monitor. Plts stable - no bleeding documented - being on abx can make pt more sensitive to warfarin (on ceftriaxone)  Goal of Therapy:  Goal INR 2.5-3.5 per [facility] anti-coag clinic notes Monitor platelets  by anticoagulation protocol: Yes   Plan:  1) No warfarin today 2) Monitor for s/s bleeding 3) Daily INR   Adrian Saran, PharmD, BCPS Pager [phone] 01/25/2018 8:48 AM

## 2018-01-26 LAB — CBC WITH DIFFERENTIAL/PLATELET
Basophils Absolute: 0 10*3/uL (ref 0.0–0.1)
Basophils Relative: 0 %
EOS PCT: 0 %
Eosinophils Absolute: 0 10*3/uL (ref 0.0–0.7)
HCT: 21.6 % — ABNORMAL LOW (ref 36.0–46.0)
HEMOGLOBIN: 7.2 g/dL — AB (ref 12.0–15.0)
LYMPHS ABS: 0.9 10*3/uL (ref 0.7–4.0)
LYMPHS PCT: 8 %
MCH: 32.1 pg (ref 26.0–34.0)
MCHC: 33.3 g/dL (ref 30.0–36.0)
MCV: 96.4 fL (ref 78.0–100.0)
MONOS PCT: 11 %
Monocytes Absolute: 1.3 10*3/uL — ABNORMAL HIGH (ref 0.1–1.0)
NEUTROS PCT: 81 %
Neutro Abs: 9 10*3/uL — ABNORMAL HIGH (ref 1.7–7.7)
Platelets: 259 10*3/uL (ref 150–400)
RBC: 2.24 MIL/uL — AB (ref 3.87–5.11)
RDW: 14.6 % (ref 11.5–15.5)
WBC: 11.2 10*3/uL — ABNORMAL HIGH (ref 4.0–10.5)

## 2018-01-26 LAB — BASIC METABOLIC PANEL WITH GFR
Anion gap: 11 (ref 5–15)
BUN: 12 mg/dL (ref 6–20)
CO2: 21 mmol/L — ABNORMAL LOW (ref 22–32)
Calcium: 8.3 mg/dL — ABNORMAL LOW (ref 8.9–10.3)
Chloride: 97 mmol/L — ABNORMAL LOW (ref 101–111)
Creatinine, Ser: 0.44 mg/dL (ref 0.44–1.00)
GFR calc Af Amer: >60 mL/min
GFR calc non Af Amer: >60 mL/min
Glucose, Bld: 111 mg/dL — ABNORMAL HIGH (ref 65–99)
Potassium: 4.2 mmol/L (ref 3.5–5.1)
Sodium: 129 mmol/L — ABNORMAL LOW (ref 135–145)

## 2018-01-26 LAB — PROTIME-INR
INR: 2.77
Prothrombin Time: 29.1 seconds — ABNORMAL HIGH (ref 11.4–15.2)

## 2018-01-26 LAB — HEMOGLOBIN AND HEMATOCRIT, BLOOD
HCT: 33.1 % — ABNORMAL LOW (ref 36.0–46.0)
HEMOGLOBIN: 11.1 g/dL — AB (ref 12.0–15.0)

## 2018-01-26 LAB — CORTISOL-AM, BLOOD: Cortisol - AM: 4.2 ug/dL — ABNORMAL LOW (ref 6.7–22.6)

## 2018-01-26 LAB — MAGNESIUM: Magnesium: 2.1 mg/dL (ref 1.7–2.4)

## 2018-01-26 LAB — PREPARE RBC (CROSSMATCH)

## 2018-01-26 MED ORDER — TRAMADOL HCL 50 MG PO TABS: 50.0000 mg | ORAL_TABLET | Freq: Four times a day (QID) | ORAL | 0 refills | Status: AC | PRN

## 2018-01-26 MED ORDER — ENSURE SURGERY PO LIQD
237.0000 mL | Freq: Two times a day (BID) | ORAL | Status: DC
  Administered 2018-01-26 – 2018-01-27 (×4): 237 mL via ORAL
  Filled 2018-01-26 (×4): qty 237

## 2018-01-26 MED ORDER — SODIUM CHLORIDE 0.9 % IV SOLN
Freq: Once | INTRAVENOUS | Status: AC
  Administered 2018-01-26: 09:00:00 via INTRAVENOUS

## 2018-01-26 MED ORDER — ACETAMINOPHEN 325 MG PO TABS
650.0000 mg | ORAL_TABLET | Freq: Four times a day (QID) | ORAL | Status: DC | PRN
  Administered 2018-01-26: 650 mg via ORAL
  Filled 2018-01-26: qty 2

## 2018-01-26 MED ORDER — ACETAMINOPHEN 325 MG PO TABS
650.0000 mg | ORAL_TABLET | Freq: Once | ORAL | Status: AC
  Administered 2018-01-26: 650 mg via ORAL
  Filled 2018-01-26: qty 2

## 2018-01-26 MED ORDER — DIPHENHYDRAMINE HCL 25 MG PO CAPS
25.0000 mg | ORAL_CAPSULE | Freq: Once | ORAL | Status: AC
  Administered 2018-01-26: 25 mg via ORAL
  Filled 2018-01-26: qty 1

## 2018-01-26 MED ORDER — WARFARIN SODIUM 7.5 MG PO TABS
7.5000 mg | ORAL_TABLET | Freq: Once | ORAL | Status: AC
  Administered 2018-01-26: 7.5 mg via ORAL
  Filled 2018-01-26: qty 1

## 2018-01-26 NOTE — Progress Notes (Signed)
Initial Nutrition Assessment  DOCUMENTATION CODES:   Not applicable  INTERVENTION:   - Continue Ensure Surgery BID, each provides 330 kcal and 18 grams protein (note: contains 30% DV of vitamin K - alerted attending MD)  - Encourage PO intake  NUTRITION DIAGNOSIS:   Inadequate oral intake related to poor appetite as evidenced by per patient/family report.  GOAL:   Patient will meet greater than or equal to 90% of their needs  MONITOR:   PO intake, Supplement acceptance, Labs, Weight trends  REASON FOR ASSESSMENT:   Consult Assessment of nutrition requirement/status, Poor PO  ASSESSMENT:   77 year old female who presented for routine left total knee arthroplasty on 01/19/18 with the diagnosis of osteoarthritis. PMH significant for mechanical aortic valve on chronic warfarin, hypertension, and dyslipidemia.   Pt with several syncopal episodes since admission.  Spoke with pt at bedside who reports wanting to have an oral nutrition supplement but being concerned of vitamin K content as she is on warfarin. Pt enjoyed Ensure Surgery oral nutrition supplement provided to her today which contains 30% DV of vitamin K. Spoke with attending MD who has agreed to continue this supplement.  Pt reports eating cottage cheese, fruit, and orange juice this morning for breakfast. She has not yet ordered lunch. RD offered to order pt's lunch, but pt declined stating, "I'm really not hungry." RD encouraged PO intake.  Pt states that she usually eats 3 meals and snacks daily. Breakfast might include a grilled cheese or a sausage and egg muffin with a moca frappe. Lunch includes a sandwich. Dinner might include soup or spaghetti. Pt snacks on Hershey chocolate, almonds, and peanut butter crackers.  Pt states that she fell 4-5 years ago and had to get back surgery and that her appetite has never been the same since. Pt endorses weight loss during this time as well.  Pt reports her UBW as 150 lbs and  that she last weighed this 4-5 years ago before her back surgery. Pt reports that her most recent UBW has been 130 lbs.  Meal Completion: 10-75% since 01/21/18  Medications reviewed and include: 100 mg Colace BID, 2.5 mg midodrine TID, 40 mg pravastatin daily, warfarin  Labs reviewed: sodium 129 (L), chloride 97 (L), CO2 21 (L), hemoglobin 7.2 (L), HCT 21.6 (L)  NUTRITION - FOCUSED PHYSICAL EXAM:   Most Recent Value  Orbital Region  Mild depletion  Upper Arm Region  No depletion  Thoracic and Lumbar Region  Mild depletion  Buccal Region  Mild depletion  Temple Region  Moderate depletion  Clavicle Bone Region  Moderate depletion  Clavicle and Acromion Bone Region  Moderate depletion  Scapular Bone Region  Unable to assess  Dorsal Hand  Mild depletion  Patellar Region  Mild depletion  Anterior Thigh Region  Mild depletion  Posterior Calf Region  Mild depletion  Edema (RD Assessment)  Mild  Hair  Reviewed  Eyes  Reviewed  Mouth  Reviewed  Skin  Reviewed  Nails  Reviewed      Diet Order:   Diet Order           Diet regular Room service appropriate? Yes; Fluid consistency: Thin  Diet effective now        Diet - low sodium heart healthy          EDUCATION NEEDS:   Education needs have been addressed  Skin:  Skin Assessment: Skin Integrity Issues: Skin Integrity Issues:: Incisions Incisions: surgical incision to left knee  Last BM:  01/24/18 small type 7  Height:   Ht Readings from Last 1 Encounters:  01/19/18 5' 5.5" (1.664 m)    Weight:   Wt Readings from Last 1 Encounters:  01/19/18 124 lb (56.2 kg)    Ideal Body Weight:  58 kg  BMI:  Body mass index is 20.32 kg/m.  Estimated Nutritional Needs:   Kcal:  1550-1750 kcal/day  Protein:  70-85 grams/day  Fluid:  1.5-1.7 L/day    Gaynell Face, MS, RD, LDN Pager: [phone] Weekend/After Hours: [phone]

## 2018-01-26 NOTE — Progress Notes (Signed)
ANTICOAGULATION CONSULT NOTE - Follow Up Consult  Pharmacy Consult for warfarin Indication: mechanical Aortic valve, post-op L total knee arthroplasty  No Known Allergies  Patient Measurements: Height: 5' 5.5" (166.4 cm) Weight: 124 lb (56.2 kg) IBW/kg (Calculated) : 58.15   Vital Signs: Temp: 98.9 F (37.2 C) (05/13 0920) Temp Source: Oral (05/13 0920) BP: 147/58 (05/13 0920) Pulse Rate: 84 (05/13 0920)  Labs: Recent Labs    01/24/18 0442 01/25/18 0451 01/26/18 0532  HGB 8.9* 8.0* 7.2*  HCT 26.1* 23.5* 21.6*  PLT 221 224 259  LABPROT 31.0* 36.7* 29.1*  INR 3.01 3.74 2.77  CREATININE 0.48 0.48 0.44    Estimated Creatinine Clearance: 54.7 mL/min (by C-G formula based on SCr of 0.44 mg/dL).   Medications:  PTA warfarin regimen: 7.5 mg daily except 3.75mg  on TTSat  Assessment: Patient's a 77 y.o F with hx mechanical aortic valve on warfarin PTA, presented to [facility] on 01/19/18 for left TKA.  Warfarin resumed post-op.  Per [facility] Anti-coag clinic notes, "After discussion with Dr Donnetta Hutching, I called pt with update that she does not require lovenox bridge prior to surgery, she will start holding her warfarin on 01/13/18 until her surgery 01/19/18, and then after surgery she will take 1 extra tablet of warfarin [15 mg total dose] for 2 days then continue her usual dosing and see me 7 days later"  Significant Events: - 5/7 Lovenox 40mg  SQ daily ordered post-operatively in place of bridge to therapeutic INR - 5/9 patient experienced syncopal event overnight d/t anemia. TRH giving PRBC and increasing Lovenox to therapeutic dose per Rx consult. 5/11: D/C lovenox due to therapeutic INR  Today, 01/26/2018: - INR 2.77, therapeutic - Hgb 7.2 trending downward, monitor. Plts stable - oozing blood into thigh/knee - poor appetite  Goal of Therapy:  Goal INR 2.5-3.5 per [facility] anti-coag clinic notes Monitor platelets by anticoagulation protocol: Yes   Plan:  1) warfarin 7.5mg  po at  1800 2) Monitor for s/s bleeding 3) Daily INR   Dolly Rias RPh 01/26/2018, 9:54 AM Pager [phone]

## 2018-01-26 NOTE — Progress Notes (Signed)
Inpatient Rehabilitation  Received request for an IP Rehab consult.  Note that diagnosis is a unilateral knee replacement with [facility] Medicare.  This diagnosis is a difficult one to get authorization on for IP Rehab due to typically not meeting our medical criteria.  Additionally, given inability to tolerate out of bed activity at this time not appropriate for this level of intensive therapy.  Note plans for SNF post acute rehab and agree with this plan.  Will sign off at this time, call if questions.   Carmelia Roller., CCC/SLP Admission Coordinator  [place]  Cell [phone]

## 2018-01-26 NOTE — Progress Notes (Signed)
Spoke with pt's husband re: SNF bed offers- selects [facility] (requests private room). Confirmed with admissions- facility to request insurance authorization from [facility]. Will assist with transition to [facility] at [place].  Sharren Bridge, MSW, LCSW Clinical Social Work 01/26/2018 [phone]

## 2018-01-26 NOTE — Progress Notes (Signed)
   Subjective: 7 Days Post-Op Procedure(s) (LRB): LEFT TOTAL KNEE ARTHROPLASTY (Left) Patient reports pain as mild.   Plan is to go Home after hospital stay. Still getting dizzy upon getting up. Complains of poor appetite  Objective: Vital signs in last 24 hours: Temp:  [97.9 F (36.6 C)-99 F (37.2 C)] 98.3 F (36.8 C) (05/13 0350) Pulse Rate:  [82-120] 86 (05/13 0350) Resp:  [12-18] 18 (05/13 0350) BP: (96-147)/(48-85) 132/85 (05/13 0350) SpO2:  [96 %-100 %] 96 % (05/13 0350)  Intake/Output from previous day:  Intake/Output Summary (Last 24 hours) at 01/26/2018 0642 Last data filed at 01/26/2018 0354 Gross per 24 hour  Intake 240 ml  Output 1200 ml  Net -960 ml    Intake/Output this shift: Total I/O In: 240 [P.O.:240] Out: 500 [Urine:500]  Labs: Recent Labs    01/24/18 0442 01/25/18 0451 01/26/18 0532  HGB 8.9* 8.0* 7.2*   Recent Labs    01/25/18 0451 01/26/18 0532  WBC 12.1* 11.2*  RBC 2.45* 2.24*  HCT 23.5* 21.6*  PLT 224 259   Recent Labs    01/25/18 0451 01/26/18 0532  NA 131* 129*  K 4.2 4.2  CL 97* 97*  CO2 23 21*  BUN 11 12  CREATININE 0.48 0.44  GLUCOSE 116* 111*  CALCIUM 8.4* 8.3*   Recent Labs    01/25/18 0451 01/26/18 0532  INR 3.74 2.77    EXAM General - Patient is Alert, Appropriate and Oriented Extremity - Neurologically intact Neurovascular intact No cellulitis present Moderate swelling in knee and thigh but not excessive Dressing/Incision - clean, dry, no drainage Motor Function - intact, moving foot and toes well on exam.   Past Medical History:  Diagnosis Date  . Cataract    Right Eye  . Chronic back pain   . Cubital tunnel syndrome    left  . Diverticulosis   . History of aortic valve disorder   . History of [facility] spotted fever   . History of subacute bacterial endocarditis   . Hyperlipidemia   . Osteoarthritis of both knees    and hands  . Osteopenia   . Skin cancer    Face     Assessment/Plan: 7 Days Post-Op Procedure(s) (LRB): LEFT TOTAL KNEE ARTHROPLASTY (Left) Principal Problem:   OA (osteoarthritis) of knee   Up with therapy  Transfuse 2 units PRBCs for Hb 7.2 with symptomatic anemia. Her INR was 3.7 yesterday and she is still oozing blood into her thigh/knee thus hemoglobin is decreasing. Has hematoma but not to the extent that any surgical drainage is necessary and no advanced imaging such as CT is necessary.This will stabilize when INR is better controlled.  DVT Prophylaxis - Coumadin Weight-Bearing as tolerated to left leg  Marissa Brown 01/26/2018, 6:42 AM

## 2018-01-26 NOTE — Progress Notes (Addendum)
Patient ID: Marissa Brown, female   DOB: 01-Mar-1941, 77 y.o.   MRN: [id number]  PROGRESS NOTE    Marissa Brown  JOI:[id number] DOB: 1941-05-25 DOA: 01/19/2018 PCP: Jonathon Resides, MD   Brief Narrative:  77 year old female with history of mechanical aortic valve on chronic warfarin, hypertension, dyslipidemia and left knee osteoarthritis underwent left total knee arthroplasty on 01/19/2018.  Hospitalist service was consulted on 01/22/2018 for orthostatic hypotension and dizziness and patient apparently had passed out.  She was started on intravenous fluids and antihypertensives were held.  Assessment & Plan:   Principal Problem:   OA (osteoarthritis) of knee   Syncope -Probable vasovagal versus orthostatic -Check orthostatic vitals today again.  Continue physical therapy.  Midodrine started on 01/25/2018 which will be continued -2D echo, carotid ultrasound, EEG have remained unremarkable.  CT of the brain was negative for acute intracranial abnormality.  Patient does not have any focal neurologic deficit. -Compression stockings. -Patient will need rehab placement.  Social worker following. -Spoke to Dr. McDowell/cardiology on 01/24/2018 and he thinks that patient does not need outpatient Holter monitoring at this time as telemetry did not pick up any abnormalities.  Patient can follow-up with her regular cardiologist as an outpatient to decide regarding the need for same. -Fall precautions -EKG showed chronic right bundle branch block  Acute blood loss anemia in a patient who recently had left knee arthroplasty -Status post 1 unit of packed red cell transfusion on 01/22/2018.  Hemoglobin is 7.2 today.  Orthopedics is transfusing 2 units of packed red cells.  Repeat a.m. hemoglobin.  No signs of GI blood loss  History of mechanical aortic valve on chronic warfarin -INR therapeutic. Continue Coumadin.  keep INR between 2.5-3.5.  Repeat a.m. INR  Left knee osteoarthritis status post left total knee  arthroplasty -Care as per orthopedics.  PT evaluation -Pain management as per orthopedics.  Leukocytosis -Probably reactive.    Urinary tract infection has been ruled out -Discontinued Rocephin  Hypertension -Blood pressure stable for now.  Will monitor.  Antihypertensives on hold  DVT prophylaxis:  Coumadin Code Status: Full Family Communication: None at bedside Disposition Plan: Probable rehab placement  Procedures: Left knee total arthroplasty on 01/19/2018  Antimicrobials: Rocephin from 01/22/2018-01/23/2018   Subjective: Patient was seen at bedside.  Patient denies any overnight fever, nausea, vomiting.  Complains of back and left knee pain.. Objective: Vitals:   01/26/18 0058 01/26/18 0350 01/26/18 0920 01/26/18 0948  BP: 131/85 132/85 (!) 147/58 (!) (P) 142/61  Pulse: 82 86 84 (P) 79  Resp: 18 18 14  (P) 14  Temp: 98 F (36.7 C) 98.3 F (36.8 C) 98.9 F (37.2 C) (P) 99 F (37.2 C)  TempSrc: Oral Oral Oral (P) Oral  SpO2: 97% 96% 97% (P) 98%  Weight:      Height:        Intake/Output Summary (Last 24 hours) at 01/26/2018 1118 Last data filed at 01/26/2018 1109 Gross per 24 hour  Intake 440 ml  Output 1200 ml  Net -760 ml   Filed Weights   01/19/18 1052  Weight: 56.2 kg (124 lb)    Examination:  General exam: No acute distress. respiratory system: Bilateral decreased breath sounds at bases  cardiovascular system: S1-S2 positive, rate controlled  gastrointestinal system: Abdomen is nondistended, soft and nontender. Normal bowel sounds heard. Extremities: No cyanosis, edema.  Left knee dressing present     Data Reviewed: I have personally reviewed following labs and imaging studies  CBC:  Recent Labs  Lab 01/22/18 0528 01/22/18 1600 01/23/18 0500 01/24/18 0442 01/25/18 0451 01/26/18 0532  WBC 9.4  --  11.2* 11.7* 12.1* 11.2*  NEUTROABS  --   --  9.4* 10.3* 9.6* 9.0*  HGB 8.6* 10.2* 10.4* 8.9* 8.0* 7.2*  HCT 25.6* 29.4* 30.6* 26.1* 23.5* 21.6*    MCV 98.5  --  94.7 94.6 95.9 96.4  PLT 163  --  186 221 224 053   Basic Metabolic Panel: Recent Labs  Lab 01/22/18 0528 01/23/18 0500 01/24/18 0442 01/25/18 0451 01/26/18 0532  NA 136 136 134* 131* 129*  K 4.2 3.4* 4.1 4.2 4.2  CL 103 100* 100* 97* 97*  CO2 24 24 24 23  21*  GLUCOSE 99 116* 138* 116* 111*  BUN 9 7 8 11 12   CREATININE 0.55 0.51 0.48 0.48 0.44  CALCIUM 8.4* 8.5* 8.7* 8.4* 8.3*  MG  --   --  2.1 2.1 2.1   GFR: Estimated Creatinine Clearance: 54.7 mL/min (by C-G formula based on SCr of 0.44 mg/dL). Liver Function Tests: Recent Labs  Lab 01/23/18 0500 01/24/18 0442  AST 34 24  ALT 22 20  ALKPHOS 40 36*  BILITOT 0.8 0.8  PROT 6.3* 6.2*  ALBUMIN 3.3* 3.2*   No results for input(s): LIPASE, AMYLASE in the last 168 hours. No results for input(s): AMMONIA in the last 168 hours. Coagulation Profile: Recent Labs  Lab 01/22/18 0528 01/23/18 0500 01/24/18 0442 01/25/18 0451 01/26/18 0532  INR 2.06 2.19 3.01 3.74 2.77   Cardiac Enzymes: No results for input(s): CKTOTAL, CKMB, CKMBINDEX, TROPONINI in the last 168 hours. BNP (last 3 results) No results for input(s): PROBNP in the last 8760 hours. HbA1C: No results for input(s): HGBA1C in the last 72 hours. CBG: Recent Labs  Lab 01/22/18 0225  GLUCAP 129*   Lipid Profile: No results for input(s): CHOL, HDL, LDLCALC, TRIG, CHOLHDL, LDLDIRECT in the last 72 hours. Thyroid Function Tests: No results for input(s): TSH, T4TOTAL, FREET4, T3FREE, THYROIDAB in the last 72 hours. Anemia Panel: No results for input(s): VITAMINB12, FOLATE, FERRITIN, TIBC, IRON, RETICCTPCT in the last 72 hours. Sepsis Labs: No results for input(s): PROCALCITON, LATICACIDVEN in the last 168 hours.  Recent Results (from the past 240 hour(s))  Culture, Urine     Status: None   Collection Time: 01/22/18  9:32 AM  Result Value Ref Range Status   Specimen Description   Final    URINE, RANDOM Performed at [facility], [place] [address] [place], [place] [address]    Special Requests   Final    NONE Performed at [facility], [place] [address], [place], [place] [address]    Culture   Final    NO GROWTH Performed at [facility], [place] [address], [place], [place] [address]    Report Status 01/24/2018 FINAL  Final         Radiology Studies: No results found.      Scheduled Meds: . docusate sodium  100 mg Oral BID  . feeding supplement  237 mL Oral BID BM  . midodrine  2.5 mg Oral TID WC  . pravastatin  40 mg Oral QHS  . predniSONE  5 mg Oral 4X daily taper  . warfarin  7.5 mg Oral ONCE-1800  . Warfarin - Pharmacist Dosing Inpatient   Does not apply q1800   Continuous Infusions:    LOS: 7 days        Aline August, MD  [facility] Pager [phone]  If 7PM-7AM, please contact night-coverage [email or web address] Password [email or web address] 01/26/2018, 11:18 AM

## 2018-01-26 NOTE — Progress Notes (Signed)
Physical Therapy Treatment Patient Details Name: Marissa Brown MRN: [id number] DOB: 01-11-1941 Today's Date: 01/26/2018    History of Present Illness 77 yo female s/p L TKA 01/19/18, also presents with L sciatica pain    PT Comments    RN reports pt is s/p PRBCs transfusion so assisted with obtaining orthostatics.  Pt denies any symptoms with standing so assisted to recliner.  Pt felt able to attempt ambulation so assisted with short distance ambulation with recliner following for safety. Pt with improved ability to tolerate mobility today!  Provided ice to L knee.     Follow Up Recommendations  Follow surgeon's recommendation for DC plan and follow-up therapies(plan for SNF at this time)     Equipment Recommendations  Rolling walker with 5" wheels;3in1 (PT)    Recommendations for Other Services       Precautions / Restrictions Precautions Precautions: Fall;Knee Required Braces or Orthoses: Knee Immobilizer - Left Knee Immobilizer - Left: Discontinue once straight leg raise with < 10 degree lag Restrictions Other Position/Activity Restrictions: WBAT    Mobility  Bed Mobility Overal bed mobility: Needs Assistance Bed Mobility: Supine to Sit     Supine to sit: Min assist     General bed mobility comments: assist for L LE and increased time  Transfers Overall transfer level: Needs assistance Equipment used: Rolling walker (2 wheeled) Transfers: Sit to/from Stand Sit to Stand: Min guard         General transfer comment: obtained orthostatics (pt s/p PRBCs) which RN scanned badge, pt denies symptoms, pt with urinary incontinence with standing so transferred to recliner first, pt assisted with donning undergarment and agreeable to attempt to ambulate; verbal cues for UE and LE positioning  Ambulation/Gait Ambulation/Gait assistance: Min assist Ambulation Distance (Feet): 35 Feet Assistive device: Rolling walker (2 wheeled) Gait Pattern/deviations: Step-to  pattern;Antalgic;Decreased stance time - left     General Gait Details: verbal cues for sequence, RW positioning, pt denies dizziness, recliner following for safety, therapist limited distance due to first time OOB in a few days (slowly improve mobility)   Stairs             Wheelchair Mobility    Modified Rankin (Stroke Patients Only)       Balance                                            Cognition Arousal/Alertness: Awake/alert Behavior During Therapy: WFL for tasks assessed/performed Overall Cognitive Status: Within Functional Limits for tasks assessed                                        Exercises      General Comments        Pertinent Vitals/Pain Pain Assessment: 0-10 Pain Score: 5  Pain Location: L hip Pain Descriptors / Indicators: Sore Pain Intervention(s): Monitored during session;Patient requesting pain meds-RN notified;Repositioned;Ice applied(ice to knee)    Home Living                      Prior Function            PT Goals (current goals can now be found in the care plan section) Progress towards PT goals: Progressing toward goals    Frequency  7X/week      PT Plan Current plan remains appropriate    Co-evaluation              AM-PAC PT "6 Clicks" Daily Activity  Outcome Measure  Difficulty turning over in bed (including adjusting bedclothes, sheets and blankets)?: A Little Difficulty moving from lying on back to sitting on the side of the bed? : Unable Difficulty sitting down on and standing up from a chair with arms (e.g., wheelchair, bedside commode, etc,.)?: Unable Help needed moving to and from a bed to chair (including a wheelchair)?: A Little Help needed walking in hospital room?: A Little Help needed climbing 3-5 steps with a railing? : A Lot 6 Click Score: 13    End of Session Equipment Utilized During Treatment: Gait belt;Left knee immobilizer Activity  Tolerance: Patient tolerated treatment well Patient left: in chair;with call bell/phone within reach;with nursing/sitter in room   PT Visit Diagnosis: Difficulty in walking, not elsewhere classified (R26.2);Pain Pain - Right/Left: Left Pain - part of body: Knee     Time: 6256-3893 PT Time Calculation (min) (ACUTE ONLY): 28 min  Charges:  $Gait Training: 8-22 mins $Therapeutic Activity: 8-22 mins                    G Codes:       Carmelia Bake, PT, DPT 01/26/2018 Pager: [phone]  York Ram E 01/26/2018, 4:23 PM

## 2018-01-27 DIAGNOSIS — D62 Acute posthemorrhagic anemia: Secondary | ICD-10-CM

## 2018-01-27 LAB — BASIC METABOLIC PANEL
ANION GAP: 10 (ref 5–15)
BUN: 14 mg/dL (ref 6–20)
CHLORIDE: 99 mmol/L — AB (ref 101–111)
CO2: 25 mmol/L (ref 22–32)
Calcium: 8.7 mg/dL — ABNORMAL LOW (ref 8.9–10.3)
Creatinine, Ser: 0.47 mg/dL (ref 0.44–1.00)
GFR calc non Af Amer: >60 mL/min (ref >60)
Glucose, Bld: 110 mg/dL — ABNORMAL HIGH (ref 65–99)
POTASSIUM: 4.4 mmol/L (ref 3.5–5.1)
Sodium: 134 mmol/L — ABNORMAL LOW (ref 135–145)

## 2018-01-27 LAB — CBC WITH DIFFERENTIAL/PLATELET
BASOS ABS: 0 10*3/uL (ref 0.0–0.1)
Basophils Relative: 0 %
Eosinophils Absolute: 0.1 10*3/uL (ref 0.0–0.7)
Eosinophils Relative: 1 %
HEMATOCRIT: 30.4 % — AB (ref 36.0–46.0)
HEMOGLOBIN: 10.3 g/dL — AB (ref 12.0–15.0)
LYMPHS PCT: 8 %
Lymphs Abs: 0.9 10*3/uL (ref 0.7–4.0)
MCH: 29.8 pg (ref 26.0–34.0)
MCHC: 33.9 g/dL (ref 30.0–36.0)
MCV: 87.9 fL (ref 78.0–100.0)
MONO ABS: 1.4 10*3/uL — AB (ref 0.1–1.0)
Monocytes Relative: 11 %
NEUTROS ABS: 9.7 10*3/uL — AB (ref 1.7–7.7)
NEUTROS PCT: 80 %
Platelets: 288 10*3/uL (ref 150–400)
RBC: 3.46 MIL/uL — AB (ref 3.87–5.11)
RDW: 22.4 % — ABNORMAL HIGH (ref 11.5–15.5)
WBC: 12.1 10*3/uL — ABNORMAL HIGH (ref 4.0–10.5)

## 2018-01-27 LAB — BPAM RBC
BLOOD PRODUCT EXPIRATION DATE: 201906142359
Blood Product Expiration Date: 201905182359
ISSUE DATE / TIME: 201905130928
ISSUE DATE / TIME: 201905131219
UNIT TYPE AND RH: 600
Unit Type and Rh: 600

## 2018-01-27 LAB — TYPE AND SCREEN
ABO/RH(D): A NEG
Antibody Screen: NEGATIVE
UNIT DIVISION: 0
Unit division: 0

## 2018-01-27 LAB — MAGNESIUM: Magnesium: 2.1 mg/dL (ref 1.7–2.4)

## 2018-01-27 LAB — PROTIME-INR
INR: 2.5
Prothrombin Time: 26.8 seconds — ABNORMAL HIGH (ref 11.4–15.2)

## 2018-01-27 MED ORDER — MIDODRINE HCL 2.5 MG PO TABS: 2.5000 mg | ORAL_TABLET | Freq: Three times a day (TID) | ORAL | 0 refills | Status: DC

## 2018-01-27 MED ORDER — WARFARIN SODIUM 2.5 MG PO TABS
3.7500 mg | ORAL_TABLET | Freq: Once | ORAL | Status: DC
  Filled 2018-01-27: qty 1

## 2018-01-27 NOTE — Progress Notes (Signed)
   Subjective: 8 Days Post-Op Procedure(s) (LRB): LEFT TOTAL KNEE ARTHROPLASTY (Left) Patient reports pain as mild.   Patient seen in rounds with Dr. Wynelle Link. Patient is well, and has had no acute complaints or problems. Able to get up without dizziness yesterday. Reports desire to go to SNF.   Objective: Vital signs in last 24 hours: Temp:  [97.7 F (36.5 C)-99 F (37.2 C)] 97.7 F (36.5 C) (05/14 0448) Pulse Rate:  [75-86] 75 (05/14 0448) Resp:  [14-16] 16 (05/14 0448) BP: (131-177)/(56-82) 165/82 (05/14 0539) SpO2:  [97 %-100 %] 97 % (05/14 0448)  Intake/Output from previous day:  Intake/Output Summary (Last 24 hours) at 01/27/2018 0708 Last data filed at 01/27/2018 0448 Gross per 24 hour  Intake 1590 ml  Output 900 ml  Net 690 ml     Labs: Recent Labs    01/25/18 0451 01/26/18 0532 01/26/18 1717 01/27/18 0421  HGB 8.0* 7.2* 11.1* 10.3*   Recent Labs    01/26/18 0532 01/26/18 1717 01/27/18 0421  WBC 11.2*  --  12.1*  RBC 2.24*  --  3.46*  HCT 21.6* 33.1* 30.4*  PLT 259  --  288   Recent Labs    01/26/18 0532 01/27/18 0421  NA 129* 134*  K 4.2 4.4  CL 97* 99*  CO2 21* 25  BUN 12 14  CREATININE 0.44 0.47  GLUCOSE 111* 110*  CALCIUM 8.3* 8.7*   Recent Labs    01/26/18 0532 01/27/18 0421  INR 2.77 2.50    EXAM General - Patient is Alert and Oriented Extremity - Neurologically intact Intact pulses distally Dorsiflexion/Plantar flexion intact Compartment soft  Hematoma present over left total knee Dressing/Incision - clean, dry Motor Function - intact, moving foot and toes well on exam.   Past Medical History:  Diagnosis Date  . Cataract    Right Eye  . Chronic back pain   . Cubital tunnel syndrome    left  . Diverticulosis   . History of aortic valve disorder   . History of [facility] spotted fever   . History of subacute bacterial endocarditis   . Hyperlipidemia   . Osteoarthritis of both knees    and hands  . Osteopenia     . Skin cancer    Face    Assessment/Plan: 8 Days Post-Op Procedure(s) (LRB): LEFT TOTAL KNEE ARTHROPLASTY (Left) Principal Problem:   OA (osteoarthritis) of knee  Estimated body mass index is 20.32 kg/m as calculated from the following:   Height as of this encounter: 5' 5.5" (1.664 m).   Weight as of this encounter: 56.2 kg (124 lb). Advance diet Up with therapy Discharge to SNF today if medically stable  DVT Prophylaxis - Coumadin Weight-Bearing as tolerated   Plan for DC to SNF today if medically stable. Continue PT. Follow up in office in 1 week to see Dr. Wynelle Link.  Ardeen Jourdain, PA-C Orthopaedic Surgery 01/27/2018, 7:08 AM

## 2018-01-27 NOTE — Progress Notes (Signed)
Report called to Arboriculturist at [facility]. Pt informed r/t transport and husband will take dme with home.

## 2018-01-27 NOTE — Progress Notes (Signed)
D/cing at this time via PTAR.

## 2018-01-27 NOTE — Care Management Important Message (Signed)
Important Message  Patient Details  Name: Marissa Brown MRN: [id number] Date of Birth: 1941-01-02   Medicare Important Message Given:  Yes    Kerin Salen 01/27/2018, 11:35 AMImportant Message  Patient Details  Name: Marissa Brown MRN: [id number] Date of Birth: [birth year]   Medicare Important Message Given:  Yes    Kerin Salen 01/27/2018, 11:34 AM

## 2018-01-27 NOTE — Discharge Summary (Signed)
Physician Discharge Summary  Marissa Brown ZJI:[id number] DOB: [birth year] DOA: 01/19/2018  PCP: Jonathon Resides, MD  Admit date: 01/19/2018 Discharge date: 01/27/2018  Admitted From: Home Disposition: Nursing home  Recommendations for Outpatient Follow-up:  1. Follow up with nursing home provider at earliest convenience with repeat CBC/BMP/INR at earliest convenience 2. Follow-up with orthopedics scheduled 3. Fall precautions 4. Dressing changes as per orthopedics recommendations 5. Outpatient follow-up with her regular cardiologist  Home Health: No Equipment/Devices: None  Discharge Condition: Stable CODE STATUS: Full Diet recommendation: Heart Healthy  Brief/Interim Summary: 77 year old female with history of mechanical aortic valve on chronic warfarin, hypertension, dyslipidemia and left knee osteoarthritis underwent left total knee arthroplasty on 01/19/2018.  Hospitalist service was consulted on 01/22/2018 for orthostatic hypotension and dizziness and patient apparently had passed out.  She was started on intravenous fluids and antihypertensives were held.  She was transferred to hospitalist service.  She had episodes of orthostatic hypotension and syncope a few times while she was in the hospital.  She was started on midodrine.  She also had to receive blood transfusions.  She has improved and will be discharged to a rehab facility once bed is available.   Discharge Diagnoses:  Principal Problem:   OA (osteoarthritis) of knee  Syncope -Probable vasovagal versus orthostatic -Patient had intermittent episodes of orthostatic hypotension while hospitalized. Midodrine started on 01/25/2018 which will be continued at least for now.  This needs to be reevaluated as an outpatient. -2D echo, carotid ultrasound, EEG have remained unremarkable.  CT of the brain was negative for acute intracranial abnormality.  Patient does not have any focal neurologic deficit. -Compression stockings. -Discharge  to rehab once bed is available -Spoke to Dr. McDowell/cardiology on 01/24/2018 and he thinks that patient does not need outpatient Holter monitoring at this time as telemetry did not pick up any abnormalities.  Patient can follow-up with her regular cardiologist as an outpatient to decide regarding the need for same. -Fall precautions -EKG showed chronic right bundle branch block  Acute blood loss anemia in a patient who recently had left knee arthroplasty -Status post 1 unit of packed red cell transfusion on 01/22/2018 and 2 units transfusion on 02/12/2018.  Hemoglobin stable today.  Outpatient follow-up  History of mechanical aortic valve on chronic warfarin -INR therapeutic. Continue Coumadin.  keep INR between 2.5-3.5.    Coumadin to be dosed as per INR levels in the nursing home.  Left knee osteoarthritis status post left total knee arthroplasty -Care as per orthopedics.  Outpatient follow-up with orthopedics.  Wound care as per orthopedics.    Leukocytosis -Probably reactive.    Urinary tract infection has been ruled out -Discontinued Rocephin  Hypertension -Blood pressure stable for now.    Currently not on any antihypertensives.  If blood pressure remains elevated with the use of midodrine, consider adding midodrine if orthostatic hypotension resolves as an outpatient.    Discharge Instructions  Discharge Instructions    Call MD / Call 911   Complete by:  As directed    If you experience chest pain or shortness of breath, CALL 911 and be transported to the hospital emergency room.  If you develope a fever above 101 F, pus (white drainage) or increased drainage or redness at the wound, or calf pain, call your surgeon's office.   Call MD for:  difficulty breathing, headache or visual disturbances   Complete by:  As directed    Call MD for:  extreme fatigue   Complete  by:  As directed    Call MD for:  hives   Complete by:  As directed    Call MD for:  persistant dizziness or  light-headedness   Complete by:  As directed    Call MD for:  persistant nausea and vomiting   Complete by:  As directed    Call MD for:  redness, tenderness, or signs of infection (pain, swelling, redness, odor or green/yellow discharge around incision site)   Complete by:  As directed    Call MD for:  severe uncontrolled pain   Complete by:  As directed    Call MD for:  temperature >100.4   Complete by:  As directed    Constipation Prevention   Complete by:  As directed    Drink plenty of fluids.  Prune juice may be helpful.  You may use a stool softener, such as Colace (over the counter) 100 mg twice a day.  Use MiraLax (over the counter) for constipation as needed.   Diet - low sodium heart healthy   Complete by:  As directed    Diet - low sodium heart healthy   Complete by:  As directed    Discharge instructions   Complete by:  As directed    Dr. Gaynelle Arabian Total Joint Specialist [facility] [address], [place], [place] [address] [phone]  TOTAL KNEE REPLACEMENT POSTOPERATIVE DIRECTIONS  Knee Rehabilitation, Guidelines Following Surgery  Results after knee surgery are often greatly improved when you follow the exercise, range of motion and muscle strengthening exercises prescribed by your doctor. Safety measures are also important to protect the knee from further injury. Any time any of these exercises cause you to have increased pain or swelling in your knee joint, decrease the amount until you are comfortable again and slowly increase them. If you have problems or questions, call your caregiver or physical therapist for advice.   HOME CARE INSTRUCTIONS  Remove items at home which could result in a fall. This includes throw rugs or furniture in walking pathways.  ICE to the affected knee every three hours for 30 minutes at a time and then as needed for pain and swelling.  Continue to use ice on the knee for pain and swelling from surgery. You may notice  swelling that will progress down to the foot and ankle.  This is normal after surgery.  Elevate the leg when you are not up walking on it.   Continue to use the breathing machine which will help keep your temperature down.  It is common for your temperature to cycle up and down following surgery, especially at night when you are not up moving around and exerting yourself.  The breathing machine keeps your lungs expanded and your temperature down. Do not place pillow under knee, focus on keeping the knee straight while resting  DIET You may resume your previous home diet once your are discharged from the hospital.  DRESSING / WOUND CARE / SHOWERING You may change your dressing every day with sterile gauze.  Please use good hand washing techniques before changing the dressing.  Do not use any lotions or creams on the incision until instructed by your surgeon. You may start showering once you are discharged home but do not submerge the incision under water. Just pat the incision dry and apply a dry gauze dressing on daily. Change the surgical dressing daily and reapply a dry dressing each time.  ACTIVITY Walk with your walker as  instructed. Use walker as long as suggested by your caregivers. Avoid periods of inactivity such as sitting longer than an hour when not asleep. This helps prevent blood clots.  You may resume a sexual relationship in one month or when given the OK by your doctor.  You may return to work once you are cleared by your doctor.  Do not drive a car for 6 weeks or until released by you surgeon.  Do not drive while taking narcotics.  WEIGHT BEARING Weight bearing as tolerated with assist device (walker, cane, etc) as directed, use it as long as suggested by your surgeon or therapist, typically at least 4-6 weeks.  POSTOPERATIVE CONSTIPATION PROTOCOL Constipation - defined medically as fewer than three stools per week and severe constipation as less than one stool per  week.  One of the most common issues patients have following surgery is constipation.  Even if you have a regular bowel pattern at home, your normal regimen is likely to be disrupted due to multiple reasons following surgery.  Combination of anesthesia, postoperative narcotics, change in appetite and fluid intake all can affect your bowels.  In order to avoid complications following surgery, here are some recommendations in order to help you during your recovery period.  Colace (docusate) - Pick up an over-the-counter form of Colace or another stool softener and take twice a day as long as you are requiring postoperative pain medications.  Take with a full glass of water daily.  If you experience loose stools or diarrhea, hold the colace until you stool forms back up.  If your symptoms do not get better within 1 week or if they get worse, check with your doctor.  Dulcolax (bisacodyl) - Pick up over-the-counter and take as directed by the product packaging as needed to assist with the movement of your bowels.  Take with a full glass of water.  Use this product as needed if not relieved by Colace only.   MiraLax (polyethylene glycol) - Pick up over-the-counter to have on hand.  MiraLax is a solution that will increase the amount of water in your bowels to assist with bowel movements.  Take as directed and can mix with a glass of water, juice, soda, coffee, or tea.  Take if you go more than two days without a movement. Do not use MiraLax more than once per day. Call your doctor if you are still constipated or irregular after using this medication for 7 days in a row.  If you continue to have problems with postoperative constipation, please contact the office for further assistance and recommendations.  If you experience "the worst abdominal pain ever" or develop nausea or vomiting, please contact the office immediatly for further recommendations for treatment.  ITCHING  If you experience itching with your  medications, try taking only a single pain pill, or even half a pain pill at a time.  You can also use Benadryl over the counter for itching or also to help with sleep.   TED HOSE STOCKINGS Wear the elastic stockings on both legs for three weeks following surgery during the day but you may remove then at night for sleeping.  MEDICATIONS See your medication summary on the "After Visit Summary" that the nursing staff will review with you prior to discharge.  You may have some home medications which will be placed on hold until you complete the course of blood thinner medication.  It is important for you to complete the blood thinner medication as prescribed  by your surgeon.  Continue your approved medications as instructed at time of discharge.  PRECAUTIONS If you experience chest pain or shortness of breath - call 911 immediately for transfer to the hospital emergency department.  If you develop a fever greater that 101 F, purulent drainage from wound, increased redness or drainage from wound, foul odor from the wound/dressing, or calf pain - CONTACT YOUR SURGEON.                                                   FOLLOW-UP APPOINTMENTS Make sure you keep all of your appointments after your operation with your surgeon and caregivers. You should call the office at the above phone number and make an appointment for approximately two weeks after the date of your surgery or on the date instructed by your surgeon outlined in the "After Visit Summary".   RANGE OF MOTION AND STRENGTHENING EXERCISES  Rehabilitation of the knee is important following a knee injury or an operation. After just a few days of immobilization, the muscles of the thigh which control the knee become weakened and shrink (atrophy). Knee exercises are designed to build up the tone and strength of the thigh muscles and to improve knee motion. Often times heat used for twenty to thirty minutes before working out will loosen up your tissues  and help with improving the range of motion but do not use heat for the first two weeks following surgery. These exercises can be done on a training (exercise) mat, on the floor, on a table or on a bed. Use what ever works the best and is most comfortable for you Knee exercises include:  Leg Lifts - While your knee is still immobilized in a splint or cast, you can do straight leg raises. Lift the leg to 60 degrees, hold for 3 sec, and slowly lower the leg. Repeat 10-20 times 2-3 times daily. Perform this exercise against resistance later as your knee gets better.  Quad and Hamstring Sets - Tighten up the muscle on the front of the thigh (Quad) and hold for 5-10 sec. Repeat this 10-20 times hourly. Hamstring sets are done by pushing the foot backward against an object and holding for 5-10 sec. Repeat as with quad sets.  Leg Slides: Lying on your back, slowly slide your foot toward your buttocks, bending your knee up off the floor (only go as far as is comfortable). Then slowly slide your foot back down until your leg is flat on the floor again. [place] Wings: Lying on your back spread your legs to the side as far apart as you can without causing discomfort.  A rehabilitation program following serious knee injuries can speed recovery and prevent re-injury in the future due to weakened muscles. Contact your doctor or a physical therapist for more information on knee rehabilitation.   IF YOU ARE TRANSFERRED TO A SKILLED REHAB FACILITY If the patient is transferred to a skilled rehab facility following release from the hospital, a list of the current medications will be sent to the facility for the patient to continue.  When discharged from the skilled rehab facility, please have the facility set up the patient's [place] prior to being released. Also, the skilled facility will be responsible for providing the patient with their medications at time of release from the facility to  include their  pain medication, the muscle relaxants, and their blood thinner medication. If the patient is still at the rehab facility at time of the two week follow up appointment, the skilled rehab facility will also need to assist the patient in arranging follow up appointment in our office and any transportation needs.  MAKE SURE YOU:  Understand these instructions.  Get help right away if you are not doing well or get worse.    Pick up stool softner and laxative for home use following surgery while on pain medications. Do not submerge incision under water. Please use good hand washing techniques while changing dressing each day. May shower starting three days after surgery. Please use a clean towel to pat the incision dry following showers. Continue to use ice for pain and swelling after surgery. Do not use any lotions or creams on the incision until instructed by your surgeon.   Discharge instructions   Complete by:  As directed    Dressing changes as per orthopedics recommendations Fall precautions   Increase activity slowly   Complete by:  As directed    Increase activity slowly as tolerated   Complete by:  As directed    Weight bearing as tolerated   Complete by:  As directed      Allergies as of 01/27/2018   No Known Allergies     Medication List    STOP taking these medications   celecoxib 200 MG capsule Commonly known as:  CELEBREX   HYDROcodone-acetaminophen 5-325 MG tablet Commonly known as:  NORCO/VICODIN     TAKE these medications   acetaminophen 500 MG tablet Commonly known as:  TYLENOL Take 1,000 mg by mouth every 6 (six) hours as needed for moderate pain or headache.   CALCIUM CITRATE-VITAMIN D PO Take 1 tablet by mouth daily.   diphenhydramine-acetaminophen 25-500 MG Tabs tablet Commonly known as:  TYLENOL PM Take 1 tablet by mouth at bedtime.   hydrochlorothiazide 25 MG tablet Commonly known as:  HYDRODIURIL Take 12.5 mg by mouth daily.   lisinopril 20 MG  tablet Commonly known as:  PRINIVIL,ZESTRIL Take 10 mg by mouth daily.   methocarbamol 500 MG tablet Commonly known as:  ROBAXIN Take 1 tablet (500 mg total) by mouth every 6 (six) hours as needed for muscle spasms.   midodrine 2.5 MG tablet Commonly known as:  PROAMATINE Take 1 tablet (2.5 mg total) by mouth 3 (three) times daily with meals.   ondansetron 4 MG tablet Commonly known as:  ZOFRAN Take 1 tablet (4 mg total) by mouth every 6 (six) hours as needed for nausea.   OVER THE COUNTER MEDICATION Apply 1 application topically daily as needed (for pain). Hemp Cream   pravastatin 40 MG tablet Commonly known as:  PRAVACHOL Take 40 mg by mouth at bedtime.   traMADol 50 MG tablet Commonly known as:  ULTRAM Take 1-2 tablets (50-100 mg total) by mouth every 6 (six) hours as needed for moderate pain.   warfarin 7.5 MG tablet Commonly known as:  COUMADIN Take 3.75-7.5 mg by mouth See admin instructions. Take 3.75 mg by mouth daily on Tuesday, Thursday and Saturday. Take 7.5 mg by mouth daily on all other days            Durable Medical Equipment  (From admission, onward)        Start     Ordered   01/20/18 1039  For home use only DME 3 n 1  Once  01/20/18 1039   01/20/18 1039  For home use only DME Walker rolling  Once    Question:  Patient needs a walker to treat with the following condition  Answer:  S/P knee surgery   01/20/18 1039       Discharge Care Instructions  (From admission, onward)        Start     Ordered   01/27/18 0000  Weight bearing as tolerated     01/27/18 3875     Follow-up Information    Gaynelle Arabian, MD. Schedule an appointment as soon as possible for a visit on 02/03/2018.   Specialty:  Orthopedic Surgery Contact information: [address] [place] [address] [place] [address] [phone]        [facility] - Dme Follow up.   Why:  walker and 3n1 Contact information: [address]  [address] [phone]        Jonathon Resides, MD. Schedule an appointment as soon as possible for a visit in 1 week(s).   Specialty:  Family Medicine Why:  With repeat CBC/BMP/INR Contact information: [address] [place] [address] [phone]          No Known Allergies  Consultations:  Orthopedics   Procedures/Studies: Ct Head Wo Contrast  Result Date: 01/23/2018 CLINICAL DATA:  Altered mental status EXAM: CT HEAD WITHOUT CONTRAST TECHNIQUE: Contiguous axial images were obtained from the base of the skull through the vertex without intravenous contrast. COMPARISON:  None. FINDINGS: Brain: No mass lesion, intraparenchymal hemorrhage or extra-axial collection. No evidence of acute cortical infarct. Normal appearance of the brain parenchyma and extra axial spaces for age. Vascular: No hyperdense vessel or unexpected vascular calcification. Skull: Normal visualized skull base, calvarium and extracranial soft tissues. Sinuses/Orbits: No sinus fluid levels or advanced mucosal thickening. No mastoid effusion. Normal orbits. IMPRESSION: Normal aging brain. Electronically Signed   By: Ulyses Jarred M.D.   On: 01/23/2018 16:52   Dg Hip Port Unilat With Pelvis 1v Left  Result Date: 01/22/2018 CLINICAL DATA:  Acute left hip pain. EXAM: DG HIP (WITH OR WITHOUT PELVIS) 1V PORT LEFT COMPARISON:  None. FINDINGS: There is no evidence of hip fracture or dislocation. There is no evidence of arthropathy or other focal bone abnormality. IMPRESSION: Normal left hip. Electronically Signed   By: Marijo Conception, M.D.   On: 01/22/2018 11:41    Left knee total arthroplasty on 01/19/2018   Subjective: Patient seen and examined at bedside.  She feels much better.  She thinks that she is ready to go to a rehab facility today.  No overnight fever, nausea or vomiting. Discharge Exam: Vitals:   01/27/18 0448 01/27/18 0539  BP:  (!) 165/82  Pulse: 75   Resp: 16   Temp: 97.7 F (36.5 C)    SpO2: 97%    Vitals:   01/26/18 2018 01/26/18 2041 01/27/18 0448 01/27/18 0539  BP:  (!) 177/64  (!) 165/82  Pulse: 81  75   Resp: 16  16   Temp: 98.7 F (37.1 C)  97.7 F (36.5 C)   TempSrc: Oral  Oral   SpO2: 100%  97%   Weight:      Height:        General: Pt is alert, awake, not in acute distress Cardiovascular: Rate controlled, S1/S2 + Respiratory: Bilateral decreased breath sounds at bases Abdominal: Soft, NT, ND, bowel sounds + Extremities: Left knee dressing present, no edema, no  cyanosis    The results of significant diagnostics from this hospitalization (including imaging, microbiology, ancillary and laboratory) are listed below for reference.     Microbiology: Recent Results (from the past 240 hour(s))  Culture, Urine     Status: None   Collection Time: 01/22/18  9:32 AM  Result Value Ref Range Status   Specimen Description   Final    URINE, RANDOM Performed at [facility] [address] [place], [place] [address]    Special Requests   Final    NONE Performed at [facility], [place] [address] [place], [place] [address]    Culture   Final    NO GROWTH Performed at [facility], [place] [address] [place], [place] [address]    Report Status 01/24/2018 FINAL  Final     Labs: BNP (last 3 results) No results for input(s): BNP in the last 8760 hours. Basic Metabolic Panel: Recent Labs  Lab 01/23/18 0500 01/24/18 0442 01/25/18 0451 01/26/18 0532 01/27/18 0421  NA 136 134* 131* 129* 134*  K 3.4* 4.1 4.2 4.2 4.4  CL 100* 100* 97* 97* 99*  CO2 24 24 23  21* 25  GLUCOSE 116* 138* 116* 111* 110*  BUN 7 8 11 12 14   CREATININE 0.51 0.48 0.48 0.44 0.47  CALCIUM 8.5* 8.7* 8.4* 8.3* 8.7*  MG  --  2.1 2.1 2.1 2.1   Liver Function Tests: Recent Labs  Lab 01/23/18 0500 01/24/18 0442  AST 34 24  ALT 22 20  ALKPHOS 40 36*  BILITOT 0.8 0.8  PROT 6.3* 6.2*  ALBUMIN 3.3* 3.2*   No results for input(s):  LIPASE, AMYLASE in the last 168 hours. No results for input(s): AMMONIA in the last 168 hours. CBC: Recent Labs  Lab 01/23/18 0500 01/24/18 0442 01/25/18 0451 01/26/18 0532 01/26/18 1717 01/27/18 0421  WBC 11.2* 11.7* 12.1* 11.2*  --  12.1*  NEUTROABS 9.4* 10.3* 9.6* 9.0*  --  9.7*  HGB 10.4* 8.9* 8.0* 7.2* 11.1* 10.3*  HCT 30.6* 26.1* 23.5* 21.6* 33.1* 30.4*  MCV 94.7 94.6 95.9 96.4  --  87.9  PLT 186 221 224 259  --  288   Cardiac Enzymes: No results for input(s): CKTOTAL, CKMB, CKMBINDEX, TROPONINI in the last 168 hours. BNP: Invalid input(s): POCBNP CBG: Recent Labs  Lab 01/22/18 0225  GLUCAP 129*   D-Dimer No results for input(s): DDIMER in the last 72 hours. Hgb A1c No results for input(s): HGBA1C in the last 72 hours. Lipid Profile No results for input(s): CHOL, HDL, LDLCALC, TRIG, CHOLHDL, LDLDIRECT in the last 72 hours. Thyroid function studies No results for input(s): TSH, T4TOTAL, T3FREE, THYROIDAB in the last 72 hours.  Invalid input(s): FREET3 Anemia work up No results for input(s): VITAMINB12, FOLATE, FERRITIN, TIBC, IRON, RETICCTPCT in the last 72 hours. Urinalysis    Component Value Date/Time   COLORURINE STRAW (A) 01/22/2018 0649   APPEARANCEUR CLEAR 01/22/2018 0649   LABSPEC 1.008 01/22/2018 0649   PHURINE 7.0 01/22/2018 0649   GLUCOSEU NEGATIVE 01/22/2018 0649   HGBUR SMALL (A) 01/22/2018 0649   BILIRUBINUR NEGATIVE 01/22/2018 0649   [place] 01/22/2018 0649   PROTEINUR NEGATIVE 01/22/2018 0649   NITRITE NEGATIVE 01/22/2018 0649   LEUKOCYTESUR TRACE (A) 01/22/2018 0649   Sepsis Labs Invalid input(s): PROCALCITONIN,  WBC,  LACTICIDVEN Microbiology Recent Results (from the past 240 hour(s))  Culture, Urine     Status: None   Collection Time: 01/22/18  9:32 AM  Result Value Ref Range Status   Specimen Description   Final    URINE, RANDOM Performed at [facility] [address] [place], [place] [address]     Special Requests   Final    NONE Performed at [facility], [place] [address], [place], [place] [address]    Culture   Final    NO GROWTH Performed at [facility], [place] [address] [place], [place] [address]    Report Status 01/24/2018 FINAL  Final     Time coordinating discharge: 35 minutes  SIGNED:   Aline August, MD  [facility] 01/27/2018, 10:53 AM Pager: [phone]  If 7PM-7AM, please contact night-coverage [email or web address] Password [email or web address]

## 2018-01-27 NOTE — Progress Notes (Signed)
Physical Therapy Treatment Patient Details Name: Marissa Brown MRN: [id number] DOB: 08-27-1941 Today's Date: 01/27/2018    History of Present Illness 77 yo female s/p L TKA 01/19/18, also presents with L sciatica pain    PT Comments    Pt requiring increased time to perform mobility.  Pt reports woozy and weak feeling today however denies similar feeling from previous syncope type episodes.  Pt assisted to recliner for safety.  RN into room end of session to prepare pt for d/c.  Plan for d/c to SNF today.   Follow Up Recommendations  Follow surgeon's recommendation for DC plan and follow-up therapies     Equipment Recommendations  Rolling walker with 5" wheels;3in1 (PT)    Recommendations for Other Services       Precautions / Restrictions Precautions Precautions: Fall;Knee Required Braces or Orthoses: Knee Immobilizer - Left Knee Immobilizer - Left: Discontinue once straight leg raise with < 10 degree lag Restrictions Other Position/Activity Restrictions: WBAT    Mobility  Bed Mobility Overal bed mobility: Needs Assistance Bed Mobility: Supine to Sit     Supine to sit: Min assist     General bed mobility comments: assist for L LE and increased time  Transfers Overall transfer level: Needs assistance Equipment used: Rolling walker (2 wheeled) Transfers: Sit to/from [facility] Sit to Stand: Min guard Stand pivot transfers: Min guard       General transfer comment: verbal cues for hand placement, min/guard for safety, pt standing and reports feeling weak so transferred to recliner prior to ambulating  Ambulation/Gait Ambulation/Gait assistance: Min assist Ambulation Distance (Feet): 25 Feet Assistive device: Rolling walker (2 wheeled) Gait Pattern/deviations: Step-to pattern;Antalgic;Decreased stance time - left     General Gait Details: verbal cues for sequence, RW positioning, pt reports feeling woozy and weak but not similar symptoms from  prior syncope type episodes, recliner following for safety, had pt return to sitting for safety.   Stairs             Wheelchair Mobility    Modified Rankin (Stroke Patients Only)       Balance                                            Cognition Arousal/Alertness: Awake/alert Behavior During Therapy: WFL for tasks assessed/performed Overall Cognitive Status: Within Functional Limits for tasks assessed                                        Exercises      General Comments        Pertinent Vitals/Pain Pain Assessment: 0-10 Pain Score: 7  Pain Location: L hip and knee Pain Descriptors / Indicators: Sore Pain Intervention(s): Limited activity within patient's tolerance;Repositioned;Monitored during session;RN gave pain meds during session    Home Living                      Prior Function            PT Goals (current goals can now be found in the care plan section) Progress towards PT goals: Progressing toward goals    Frequency    7X/week      PT Plan Current plan remains appropriate    Co-evaluation  AM-PAC PT "6 Clicks" Daily Activity  Outcome Measure  Difficulty turning over in bed (including adjusting bedclothes, sheets and blankets)?: A Little Difficulty moving from lying on back to sitting on the side of the bed? : A Lot Difficulty sitting down on and standing up from a chair with arms (e.g., wheelchair, bedside commode, etc,.)?: Unable Help needed moving to and from a bed to chair (including a wheelchair)?: A Little Help needed walking in hospital room?: A Little Help needed climbing 3-5 steps with a railing? : A Lot 6 Click Score: 14    End of Session Equipment Utilized During Treatment: Gait belt;Left knee immobilizer Activity Tolerance: Patient limited by fatigue Patient left: in chair;with call bell/phone within reach;with nursing/sitter in room   PT Visit Diagnosis:  Difficulty in walking, not elsewhere classified (R26.2);Pain Pain - Right/Left: Left Pain - part of body: Knee     Time: 1034-1100 PT Time Calculation (min) (ACUTE ONLY): 26 min  Charges:  $Gait Training: 8-22 mins $Therapeutic Activity: 8-22 mins                    G Codes:       Carmelia Bake, PT, DPT 01/27/2018 Pager: [phone]  York Ram E 01/27/2018, 2:35 PM

## 2018-01-27 NOTE — Clinical Social Work Placement (Signed)
Pt discharged and will admit to [facility] SNF room 7013- report [phone] Arranged PTAR transportation Husband larry notified.  CLINICAL SOCIAL WORK PLACEMENT  NOTE  Date:  01/27/2018  Patient Details  Name: Marissa Brown MRN: [id number] Date of Birth: 12-22-1940  Clinical Social Work is seeking post-discharge placement for this patient at the [place] level of care (*CSW will initial, date and re-position this form in  chart as items are completed):  Yes   Patient/family provided with [place] Work Department's list of facilities offering this level of care within the geographic area requested by the patient (or if unable, by the patient's family).  Yes   Patient/family informed of their freedom to choose among providers that offer the needed level of care, that participate in Medicare, Medicaid or managed care program needed by the patient, have an available bed and are willing to accept the patient.  Yes   Patient/family informed of [facility]'s ownership interest in [facility] and [facility], as well as of the fact that they are under no obligation to receive care at these facilities.  PASRR submitted to EDS on 01/25/18     PASRR number received on 01/25/18     Existing PASRR number confirmed on       FL2 transmitted to all facilities in geographic area requested by pt/family on 01/25/18     FL2 transmitted to all facilities within larger geographic area on       Patient informed that his/her managed care company has contracts with or will negotiate with certain facilities, including the following:            Patient/family informed of bed offers received.  Patient chooses bed at     [facility]  Physician recommends and patient chooses bed at     [facility] Patient to be transferred to [facility]   on  .01/27/18  Patient to be transferred to facility by     [place]  Patient family notified on   of transfer. 01/27/18  Name of  family member notified:      Husband Marissa Brown  PHYSICIAN      Additional Comment:    _______________________________________________ Nila Nephew, LCSW 01/27/2018, 2:31 PM  [phone]

## 2018-01-27 NOTE — Progress Notes (Signed)
ANTICOAGULATION CONSULT NOTE - Follow Up Consult  Pharmacy Consult for warfarin Indication: mechanical Aortic valve, post-op L total knee arthroplasty  No Known Allergies  Patient Measurements: Height: 5' 5.5" (166.4 cm) Weight: 124 lb (56.2 kg) IBW/kg (Calculated) : 58.15   Vital Signs: Temp: 97.7 F (36.5 C) (05/14 0448) Temp Source: Oral (05/14 0448) BP: 165/82 (05/14 0539) Pulse Rate: 75 (05/14 0448)  Labs: Recent Labs    01/25/18 0451 01/26/18 0532 01/26/18 1717 01/27/18 0421  HGB 8.0* 7.2* 11.1* 10.3*  HCT 23.5* 21.6* 33.1* 30.4*  PLT 224 259  --  288  LABPROT 36.7* 29.1*  --  26.8*  INR 3.74 2.77  --  2.50  CREATININE 0.48 0.44  --  0.47    Estimated Creatinine Clearance: 54.7 mL/min (by C-G formula based on SCr of 0.47 mg/dL).   Medications:  PTA warfarin regimen: 7.5 mg daily except 3.75mg  on TTSat  Assessment: Patient's a 77 y.o F with hx mechanical aortic valve on warfarin PTA, presented to [facility] on 01/19/18 for left TKA.  Warfarin resumed post-op.  Per [facility] Anti-coag clinic notes, "After discussion with Dr Donnetta Hutching, I called pt with update that she does not require lovenox bridge prior to surgery, she will start holding her warfarin on 01/13/18 until her surgery 01/19/18, and then after surgery she will take 1 extra tablet of warfarin [15 mg total dose] for 2 days then continue her usual dosing and see me 7 days later"  Significant Events: - 5/7 Lovenox 40mg  SQ daily ordered post-operatively in place of bridge to therapeutic INR - 5/9 patient experienced syncopal event overnight d/t anemia. TRH giving PRBC and increasing Lovenox to therapeutic dose per Rx consult. 5/11: D/C lovenox due to therapeutic INR  Today, 01/27/2018: - INR 2.5, therapeutic - Hgb 10.3, Plts stable - stable for discharge per ortho   Goal of Therapy:  Goal INR 2.5-3.5 per [facility] anti-coag clinic notes Monitor platelets by anticoagulation protocol: Yes   Plan:  1) warfarin  3.75mg  po at 1800, per home regiment 2) Monitor for s/s bleeding 3) Daily INR   Dolly Rias RPh 01/27/2018, 10:19 AM Pager [phone]

## 2018-05-18 ENCOUNTER — Encounter (HOSPITAL_COMMUNITY): Payer: Self-pay | Admitting: Internal Medicine

## 2018-05-18 ENCOUNTER — Other Ambulatory Visit: Payer: Self-pay

## 2018-05-18 ENCOUNTER — Inpatient Hospital Stay (HOSPITAL_COMMUNITY)
Admission: AD | Admit: 2018-05-18 | Discharge: 2018-05-24 | DRG: 907 | Disposition: A | Discharge status: Home Health | Payer: Medicare Other | Source: Other Acute Inpatient Hospital | Attending: Internal Medicine | Admitting: Internal Medicine

## 2018-05-18 DIAGNOSIS — M19041 Primary osteoarthritis, right hand: Secondary | ICD-10-CM | POA: Diagnosis present

## 2018-05-18 DIAGNOSIS — I700 Atherosclerosis of aorta: Secondary | ICD-10-CM | POA: Diagnosis present

## 2018-05-18 DIAGNOSIS — Z8679 Personal history of other diseases of the circulatory system: Secondary | ICD-10-CM | POA: Diagnosis not present

## 2018-05-18 DIAGNOSIS — Y838 Other surgical procedures as the cause of abnormal reaction of the patient, or of later complication, without mention of misadventure at the time of the procedure: Secondary | ICD-10-CM | POA: Diagnosis not present

## 2018-05-18 DIAGNOSIS — I10 Essential (primary) hypertension: Secondary | ICD-10-CM | POA: Diagnosis present

## 2018-05-18 DIAGNOSIS — Z96642 Presence of left artificial hip joint: Secondary | ICD-10-CM | POA: Diagnosis not present

## 2018-05-18 DIAGNOSIS — Z9851 Tubal ligation status: Secondary | ICD-10-CM | POA: Diagnosis not present

## 2018-05-18 DIAGNOSIS — E785 Hyperlipidemia, unspecified: Secondary | ICD-10-CM | POA: Diagnosis present

## 2018-05-18 DIAGNOSIS — M8580 Other specified disorders of bone density and structure, unspecified site: Secondary | ICD-10-CM | POA: Diagnosis present

## 2018-05-18 DIAGNOSIS — Z9049 Acquired absence of other specified parts of digestive tract: Secondary | ICD-10-CM | POA: Diagnosis not present

## 2018-05-18 DIAGNOSIS — M549 Dorsalgia, unspecified: Secondary | ICD-10-CM | POA: Diagnosis present

## 2018-05-18 DIAGNOSIS — M19042 Primary osteoarthritis, left hand: Secondary | ICD-10-CM | POA: Diagnosis present

## 2018-05-18 DIAGNOSIS — M179 Osteoarthritis of knee, unspecified: Secondary | ICD-10-CM | POA: Diagnosis present

## 2018-05-18 DIAGNOSIS — Z9079 Acquired absence of other genital organ(s): Secondary | ICD-10-CM

## 2018-05-18 DIAGNOSIS — Z952 Presence of prosthetic heart valve: Secondary | ICD-10-CM | POA: Diagnosis not present

## 2018-05-18 DIAGNOSIS — L89323 Pressure ulcer of left buttock, stage 3: Secondary | ICD-10-CM | POA: Diagnosis present

## 2018-05-18 DIAGNOSIS — D6832 Hemorrhagic disorder due to extrinsic circulating anticoagulants: Secondary | ICD-10-CM | POA: Diagnosis present

## 2018-05-18 DIAGNOSIS — Z85828 Personal history of other malignant neoplasm of skin: Secondary | ICD-10-CM | POA: Diagnosis not present

## 2018-05-18 DIAGNOSIS — Z7901 Long term (current) use of anticoagulants: Secondary | ICD-10-CM

## 2018-05-18 DIAGNOSIS — Z87891 Personal history of nicotine dependence: Secondary | ICD-10-CM | POA: Diagnosis not present

## 2018-05-18 DIAGNOSIS — M1711 Unilateral primary osteoarthritis, right knee: Secondary | ICD-10-CM | POA: Diagnosis present

## 2018-05-18 DIAGNOSIS — G8929 Other chronic pain: Secondary | ICD-10-CM | POA: Diagnosis present

## 2018-05-18 DIAGNOSIS — L02416 Cutaneous abscess of left lower limb: Secondary | ICD-10-CM | POA: Diagnosis present

## 2018-05-18 DIAGNOSIS — Z8249 Family history of ischemic heart disease and other diseases of the circulatory system: Secondary | ICD-10-CM

## 2018-05-18 DIAGNOSIS — M1712 Unilateral primary osteoarthritis, left knee: Secondary | ICD-10-CM | POA: Diagnosis not present

## 2018-05-18 DIAGNOSIS — Z96652 Presence of left artificial knee joint: Secondary | ICD-10-CM | POA: Diagnosis not present

## 2018-05-18 DIAGNOSIS — M1710 Unilateral primary osteoarthritis, unspecified knee: Secondary | ICD-10-CM | POA: Diagnosis present

## 2018-05-18 DIAGNOSIS — T8130XA Disruption of wound, unspecified, initial encounter: Secondary | ICD-10-CM | POA: Diagnosis present

## 2018-05-18 DIAGNOSIS — Z90710 Acquired absence of both cervix and uterus: Secondary | ICD-10-CM | POA: Diagnosis not present

## 2018-05-18 DIAGNOSIS — Z79899 Other long term (current) drug therapy: Secondary | ICD-10-CM | POA: Diagnosis not present

## 2018-05-18 DIAGNOSIS — M96840 Postprocedural hematoma of a musculoskeletal structure following a musculoskeletal system procedure: Secondary | ICD-10-CM | POA: Diagnosis present

## 2018-05-18 LAB — COMPREHENSIVE METABOLIC PANEL
ALT: 19 U/L (ref 0–44)
ANION GAP: 9 (ref 5–15)
AST: 30 U/L (ref 15–41)
Albumin: 3.2 g/dL — ABNORMAL LOW (ref 3.5–5.0)
Alkaline Phosphatase: 62 U/L (ref 38–126)
BUN: 15 mg/dL (ref 8–23)
CHLORIDE: 106 mmol/L (ref 98–111)
CO2: 26 mmol/L (ref 22–32)
Calcium: 8.9 mg/dL (ref 8.9–10.3)
Creatinine, Ser: 0.49 mg/dL (ref 0.44–1.00)
Glucose, Bld: 119 mg/dL — ABNORMAL HIGH (ref 70–99)
POTASSIUM: 4.1 mmol/L (ref 3.5–5.1)
SODIUM: 141 mmol/L (ref 135–145)
Total Bilirubin: 0.5 mg/dL (ref 0.3–1.2)
Total Protein: 6.5 g/dL (ref 6.5–8.1)

## 2018-05-18 LAB — CBC WITH DIFFERENTIAL/PLATELET
BASOS ABS: 0 10*3/uL (ref 0.0–0.1)
BASOS PCT: 0 %
Eosinophils Absolute: 0 10*3/uL (ref 0.0–0.7)
Eosinophils Relative: 0 %
HCT: 35.6 % — ABNORMAL LOW (ref 36.0–46.0)
Hemoglobin: 11.5 g/dL — ABNORMAL LOW (ref 12.0–15.0)
Lymphocytes Relative: 8 %
Lymphs Abs: 0.4 10*3/uL — ABNORMAL LOW (ref 0.7–4.0)
MCH: 32.8 pg (ref 26.0–34.0)
MCHC: 32.3 g/dL (ref 30.0–36.0)
MCV: 101.4 fL — ABNORMAL HIGH (ref 78.0–100.0)
MONO ABS: 0.4 10*3/uL (ref 0.1–1.0)
Monocytes Relative: 8 %
NEUTROS ABS: 4.3 10*3/uL (ref 1.7–7.7)
Neutrophils Relative %: 84 %
PLATELETS: 252 10*3/uL (ref 150–400)
RBC: 3.51 MIL/uL — ABNORMAL LOW (ref 3.87–5.11)
RDW: 16 % — AB (ref 11.5–15.5)
WBC: 5.2 10*3/uL (ref 4.0–10.5)

## 2018-05-18 LAB — PROTIME-INR
INR: 2.07
Prothrombin Time: 23.1 seconds — ABNORMAL HIGH (ref 11.4–15.2)

## 2018-05-18 MED ORDER — HYDROCODONE-ACETAMINOPHEN 5-325 MG PO TABS
1.0000 | ORAL_TABLET | ORAL | Status: DC | PRN
  Administered 2018-05-20: 1 via ORAL
  Filled 2018-05-18: qty 1

## 2018-05-18 MED ORDER — PIPERACILLIN-TAZOBACTAM 3.375 G IVPB
3.3750 g | Freq: Three times a day (TID) | INTRAVENOUS | Status: DC
  Administered 2018-05-18 – 2018-05-19 (×3): 3.375 g via INTRAVENOUS
  Filled 2018-05-18 (×3): qty 50

## 2018-05-18 MED ORDER — HEPARIN (PORCINE) IN NACL 100-0.45 UNIT/ML-% IJ SOLN
750.0000 [IU]/h | INTRAMUSCULAR | Status: DC
  Administered 2018-05-18: 750 [IU]/h via INTRAVENOUS
  Filled 2018-05-18: qty 250

## 2018-05-18 MED ORDER — ONDANSETRON HCL 4 MG/2ML IJ SOLN: 4.0000 mg | Freq: Four times a day (QID) | INTRAMUSCULAR | Status: DC | PRN

## 2018-05-18 MED ORDER — SODIUM CHLORIDE 0.9 % IV SOLN
INTRAVENOUS | Status: DC
  Administered 2018-05-18 – 2018-05-20 (×3): via INTRAVENOUS

## 2018-05-18 MED ORDER — ACETAMINOPHEN 650 MG RE SUPP: 650.0000 mg | Freq: Four times a day (QID) | RECTAL | Status: DC | PRN

## 2018-05-18 MED ORDER — PRAVASTATIN SODIUM 40 MG PO TABS
40.0000 mg | ORAL_TABLET | Freq: Every day | ORAL | Status: DC
  Administered 2018-05-18: 40 mg via ORAL
  Filled 2018-05-18: qty 1

## 2018-05-18 MED ORDER — VANCOMYCIN HCL IN DEXTROSE 750-5 MG/150ML-% IV SOLN
750.0000 mg | INTRAVENOUS | Status: DC
  Filled 2018-05-18: qty 150

## 2018-05-18 MED ORDER — IPRATROPIUM BROMIDE 0.02 % IN SOLN: 0.5000 mg | Freq: Four times a day (QID) | RESPIRATORY_TRACT | Status: DC

## 2018-05-18 MED ORDER — MORPHINE SULFATE (PF) 2 MG/ML IV SOLN: 1.0000 mg | INTRAVENOUS | Status: DC | PRN

## 2018-05-18 MED ORDER — ACETAMINOPHEN 325 MG PO TABS
650.0000 mg | ORAL_TABLET | Freq: Four times a day (QID) | ORAL | Status: DC | PRN
  Administered 2018-05-19 – 2018-05-23 (×5): 650 mg via ORAL
  Filled 2018-05-18 (×5): qty 2

## 2018-05-18 MED ORDER — METHOCARBAMOL 500 MG PO TABS: 500.0000 mg | ORAL_TABLET | Freq: Four times a day (QID) | ORAL | Status: DC | PRN

## 2018-05-18 MED ORDER — ONDANSETRON HCL 4 MG PO TABS: 4.0000 mg | ORAL_TABLET | Freq: Four times a day (QID) | ORAL | Status: DC | PRN

## 2018-05-18 NOTE — H&P (Signed)
History and Physical        Hospital Admission Note Date: 05/18/2018  Patient name: Marissa Brown Medical record number: [id number] Date of birth: 1940-09-24 Age: 77 y.o. Gender: female  PCP: Jonathon Resides, MD    Patient coming from: [place] [facility] ED  I have reviewed all records in the [facility].    Chief Complaint:  Left hip pain, drainage, bleeding  HPI: Patient is a 77 year old female with hypertension, hyperlipidemia, aortic valve disorder with mechanical AVR on Coumadin, left knee replacement in 5/29, left hip surgery on 04/06/2018 after a mechanical fall at [facility].  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  Patient was told by her home health nurse on Friday, 05/15/2018 that her wound looked worse.  Patient was seen by Dr. Wynelle Link and was placed on cephalexin.  Patient has been taking cephalexin until this morning.  However patient started having fevers, 102 F on Sunday.  Per patient, her husband has been assisting with the wound care at home.  This morning she woke up and she had bleeding from the left hip wound. Patient went to [facility] ED, where she was evaluated and found to have pus draining from the wound.  CT of the left hip showed 20 cm abscess per the ED physician report.  ED work-up/course: Per EDP, Dr. Posey Pronto at [facility], patient was afebrile, BP 131/56 heart rate 84 Lactate 1.4, INR 2.16 CT abd  Large, predominantly subcutaneous 19.7 cm abscess overlying the left gluteal muscles with extension to the skin surface and into the left gluteus maximus muscle  Review of Systems: Positives marked in 'bold' Constitutional: + fever, chills, diaphoresis, poor appetite and fatigue.  HEENT: Denies  photophobia, eye pain, redness, hearing loss, ear pain, congestion, sore throat, rhinorrhea, sneezing, mouth sores, trouble swallowing, neck pain, neck stiffness and tinnitus.   Respiratory: Denies SOB, DOE, cough, chest tightness,  and wheezing.   Cardiovascular: Denies chest pain, palpitations and leg swelling.  Gastrointestinal: Denies nausea, vomiting, abdominal pain, diarrhea, constipation, blood in stool and abdominal distention.  Genitourinary: Denies dysuria, urgency, frequency, hematuria, flank pain and difficulty urinating.  Musculoskeletal: Please see HPI Skin: Denies pallor, rash and wound.  Neurological: Denies dizziness, seizures, syncope, weakness, light-headedness, numbness and headaches.  Hematological: Denies adenopathy. Easy bruising, personal or family bleeding history  Psychiatric/Behavioral: Denies suicidal ideation, mood changes, confusion, nervousness, sleep disturbance and agitation  Past Medical History: Past Medical History:  Diagnosis Date  . Cataract    Right Eye  . Chronic back pain   . Cubital tunnel syndrome    left  . Diverticulosis   . History of aortic valve disorder   . History of [facility] spotted fever   . History of subacute bacterial endocarditis   . Hyperlipidemia   . Osteoarthritis of both knees    and hands  . Osteopenia   . Skin cancer    Face    Past Surgical History:  Procedure Laterality Date  . ABDOMINAL HYSTERECTOMY    .  aortic heart valve  1981   patient must have antibiotics prior to surgical procedures  . APPENDECTOMY    . BACK SURGERY    . CARDIAC VALVE REPLACEMENT  1981   Aortic Valve   . CHOLECYSTECTOMY    . COLONOSCOPY  04/13/2002  . LAPAROSCOPIC HYSTERECTOMY     Salpingo oophorectomy  . SHOULDER DEBRIDEMENT Left    Due to Calcium Deposit  . TOTAL KNEE ARTHROPLASTY Left 01/19/2018   Procedure: LEFT TOTAL KNEE ARTHROPLASTY;  Surgeon: Gaynelle Arabian, MD;  Location: [facility] ORS;  Service: Orthopedics;  Laterality: Left;   with block  . TUBAL LIGATION    . VERTEBROPLASTY N/A 03/18/2013   Procedure: Thoracic Seven,Thoracaic Eight  VERTEBROPLASTY;  Surgeon: Faythe Ghee, MD;  Location: [place] [facility] ORS;  Service: Neurosurgery;  Laterality: N/A;    Medications: Prior to Admission medications   Medication Sig Start Date End Date Taking? Authorizing Provider  acetaminophen (TYLENOL) 500 MG tablet Take 1,000 mg by mouth every 6 (six) hours as needed for moderate pain or headache.    [provider]  CALCIUM CITRATE-VITAMIN D PO Take 1 tablet by mouth daily.    [provider]  diphenhydramine-acetaminophen (TYLENOL PM) 25-500 MG TABS Take 1 tablet by mouth at bedtime.    [provider]  hydrochlorothiazide (HYDRODIURIL) 25 MG tablet Take 12.5 mg by mouth daily.    [provider]  lisinopril (PRINIVIL,ZESTRIL) 20 MG tablet Take 10 mg by mouth daily.    [provider]  methocarbamol (ROBAXIN) 500 MG tablet Take 1 tablet (500 mg total) by mouth every 6 (six) hours as needed for muscle spasms. 01/23/18   Constable, Amber, PA-C  midodrine (PROAMATINE) 2.5 MG tablet Take 1 tablet (2.5 mg total) by mouth 3 (three) times daily with meals. 01/27/18   Aline August, MD  ondansetron (ZOFRAN) 4 MG tablet Take 1 tablet (4 mg total) by mouth every 6 (six) hours as needed for nausea. 01/21/18   Constable, Amber, PA-C  OVER THE COUNTER MEDICATION Apply 1 application topically daily as needed (for pain). Hemp Cream    [provider]  pravastatin (PRAVACHOL) 40 MG tablet Take 40 mg by mouth at bedtime.     [provider]  traMADol (ULTRAM) 50 MG tablet Take 1-2 tablets (50-100 mg total) by mouth every 6 (six) hours as needed for moderate pain. 01/26/18   Constable, Amber, PA-C  warfarin (COUMADIN) 7.5 MG tablet Take 3.75-7.5 mg by mouth See admin instructions. Take 3.75 mg by mouth daily on Tuesday, Thursday and Saturday. Take 7.5 mg by mouth daily on all other days     [provider]    Allergies:  No Known Allergies  Social History:  reports that she has quit smoking. Her smoking use included cigarettes. She has never used smokeless tobacco. She reports that she drinks alcohol. She reports that she does not use drugs.  Family History: Family History  Problem Relation Age of Onset  . Heart disease Father   . Heart disease Paternal Uncle     Physical Exam: Blood pressure 136/75, pulse 86, temperature 98 F (36.7 C), temperature source Oral, resp. rate 16, SpO2 99 %. General: Alert, awake, oriented x3, in no acute distress. Eyes: pink conjunctiva,anicteric sclera, pupils equal and reactive to light and accomodation, HEENT: normocephalic, atraumatic, oropharynx clear Neck: supple, no masses or lymphadenopathy, no goiter, no bruits, no JVD CVS: Regular rate and rhythm, AV mech click, No lower extremity edema Resp : Clear to  auscultation bilaterally, no wheezing, rales or rhonchi. GI : Soft, nontender, nondistended, positive bowel sounds, no masses. No hepatomegaly. No hernia.  Musculoskeletal: Large black eschar on the left hip 5 to 3 cm, indurated erythematous area >10cm on the left hip along the surgical incision with drainage  Neuro: Grossly intact, no focal neurological deficits, strength 5/5 upper and lower extremities bilaterally Psych: alert and oriented x 3, normal mood and affect Skin: no rashes or lesions, warm and dry   LABS on Admission: I have personally reviewed all the labs and imagings below    Labs at [facility] per care everywhere  INR 2.16 PTT 55.9 WBC 6.2, hemoglobin 12.8, hematocrit 37.8, platelets 251 CMET showed sodium 138, potassium 3.8, chloride 104, CO2 27, creatinine 0.55, LFTs normal  EXAM: CT ABDOMEN AND PELVIS WITH CONTRAST at [facility]   TECHNIQUE: Multidetector CT imaging of the abdomen and pelvis was performed using the standard protocol following bolus administration of intravenous  contrast.  CONTRAST: 100 cc Omnipaque 350 intravenous contrast.  COMPARISON: CT abdomen pelvis dated July 13, 2011.  FINDINGS: Lower chest: No acute abnormality.  Hepatobiliary: No focal liver abnormality is seen. Status post cholecystectomy. No biliary dilatation.  Pancreas: Unremarkable. No pancreatic ductal dilatation or surrounding inflammatory changes.  Spleen: Normal in size without focal abnormality.  Adrenals/Urinary Tract: Adrenal glands are unremarkable. Kidneys are normal, without renal calculi, focal lesion, or hydronephrosis. Bladder is unremarkable.  Stomach/Bowel: Small hiatal hernia. The stomach is otherwise within normal limits. No bowel wall thickening, distention, or surrounding inflammatory changes. Mild increased stool burden in the left colon.  Vascular/Lymphatic: Aortic atherosclerosis. No enlarged abdominal or pelvic lymph nodes.  Reproductive: Status post hysterectomy. No adnexal masses.  Other: No free fluid or pneumoperitoneum.  Musculoskeletal: Prior left hip hemiarthroplasty. There is a large predominantly subcutaneous rim enhancing fluid collection overlying the left gluteal muscles, measuring 6.4 x 16.5 x 19.7 cm (AP by transverse by CC). This extends into the left gluteus maximus muscle and communicates with the skin surface. No clear communication with the left hip joint. No left hip joint effusion.  Unchanged chronic severe T12 and mild L4 compression deformities.  IMPRESSION: 1. Large, predominantly subcutaneous 19.7 cm abscess overlying the left gluteal muscles with extension to the skin surface and into the left gluteus maximus muscle. No clear communication with the left hip joint. 2. Aortic atherosclerosis (ICD10-I70.0).   Electronically Signed By: Titus Dubin M.D. On: 05/18/2018 10:12  EKG: Independently reviewed. None available   Assessment/Plan Principal Problem:   Abscess of left hip with history of left hip  surgery/replacement in 09/6107, complicated with large hematoma, eschar and chronic wound -CT abdomen pelvis at [facility] hospital showed large predominantly subcutaneous 19.7 cm abscess overlying the left gluteal muscle with extension to the skin surface and into the left gluteus maximus muscle.  No clear communication with the left hip joint. - Likely will need I&D in [place] consulted, discussed with Dr. Doran Durand who will evaluate patient -Repeat INR, will hold Coumadin and place patient on IV heparin drip (without bolus) per pharmacy -Per EDP, blood cultures were obtained, patient was given 1 dose of vancomycin prior to transfer. -We will continue IV vancomycin and Zosyn per pharmacy   Active Problems:   OA (osteoarthritis) of knee -Currently stable, no acute issues, continue pain control  History of aortic valve disorder with heart valve replacement with mechanical valve -Repeat INR, INR 2.16 at [facility] ED -Hold Coumadin, placed on  IV heparin drip. -Heparin drip will be placed on hold once surgery is scheduled by Ortho    Chronic back pain -Continue pain control    Hyperlipidemia -Continue pravastatin   DVT prophylaxis: IV heparin drip  CODE STATUS: Full CODE STATUS  Consults called: Orthopedics, Dr. Doran Durand  Family Communication: Admission, patients condition and plan of care including tests being ordered have been discussed with the patient who indicates understanding and agree with the plan and Code Status  Admission status: Inpatient, telemetry  Disposition plan: Further plan will depend as patient's clinical course evolves and further radiologic and laboratory data become available.    At the time of admission, it appears that the appropriate admission status for this patient is INPATIENT . This is judged to be reasonable and necessary in order to provide the required intensity of service to ensure the patient's safety given the presenting symptoms  large abscess left hip, physical exam findings, and initial radiographic and laboratory data in the context of their chronic comorbidities.  The medical decision making on this patient was of high complexity and the patient is at high risk for clinical deterioration, therefore this is a level 3 visit.   Time Spent on Admission: 65 minutes    Ripudeep Rai M.D. [facility] 05/18/2018, 3:46 PM Pager: [phone]  If 7PM-7AM, please contact night-coverage [email or web address] Password [email or web address]

## 2018-05-18 NOTE — Consult Note (Signed)
Reason for Consult: Drainage from left hip wound Referring Physician: Dr. Rai  Marissa Brown is an 76 y.o. female.  HPI: The patient is a 76-year-old woman with a past medical history significant for aortic valve replacement.  She is on chronic warfarin therapy.  She fell and broke her left hip in July of this year and underwent left hip hemiarthroplasty in [place].  She has been followed here by Dr. Alusio.  She has an eschar at the sacral area that has been treated by the wound center in [place].  3 days ago she noticed some swelling and redness at the left hip incision.  She was seen in the office and started on oral Keflex.  2 days ago she had fever to 102.1 degrees which resolved with Tylenol.  She denies any pain in her hip.  This morning the hip wound opened and started draining.  She was seen in the emergency room at [place] [facility].  She was transferred to [place] and is admitted by the [facility].  She denies chills, nausea, vomiting or changes in her appetite.  She complains of profuse drainage from her left hip over the course of the day today.  She was ambulatory around the home with a walker as needed through today.  She is on IV vancomycin and Zosyn at this time.  She is on IV heparin.  Warfarin is held.  A CT scan of the left hip was done at [place] [facility] which showed a large abscess deep to the surgical wound but superficial to the gluteal musculature with no evident involvement of the hip joint.  Past Medical History:  Diagnosis Date  . Cataract    Right Eye  . Chronic back pain   . Cubital tunnel syndrome    left  . Diverticulosis   . History of aortic valve disorder   . History of [place] spotted fever   . History of subacute bacterial endocarditis   . Hyperlipidemia   . Osteoarthritis of both knees    and hands  . Osteopenia   . Skin cancer    Face    Past Surgical History:  Procedure Laterality Date  .  ABDOMINAL HYSTERECTOMY    . aortic heart valve  1981   patient must have antibiotics prior to surgical procedures  . APPENDECTOMY    . BACK SURGERY    . CARDIAC VALVE REPLACEMENT  1981   Aortic Valve   . CHOLECYSTECTOMY    . COLONOSCOPY  04/13/2002  . LAPAROSCOPIC HYSTERECTOMY     Salpingo oophorectomy  . SHOULDER DEBRIDEMENT Left    Due to Calcium Deposit  . TOTAL KNEE ARTHROPLASTY Left 01/19/2018   Procedure: LEFT TOTAL KNEE ARTHROPLASTY;  Surgeon: Aluisio, Frank, MD;  Location: [facility];  Service: Orthopedics;  Laterality: Left;  with block  . TUBAL LIGATION    . VERTEBROPLASTY N/A 03/18/2013   Procedure: Thoracic Seven,Thoracaic Eight  VERTEBROPLASTY;  Surgeon: Randy O Kritzer, MD;  Location: [facility];  Service: Neurosurgery;  Laterality: N/A;    Family History  Problem Relation Age of Onset  . Heart disease Father   . Heart disease Paternal Uncle     Social History:  reports that she has quit smoking. Her smoking use included cigarettes. She has never used smokeless tobacco. She reports that she drinks alcohol. She reports that she does not use drugs.  Allergies: No Known Allergies  Medications: I have reviewed the   patient's current medications.  Results for orders placed or performed during the hospital encounter of 05/18/18 (from the past 48 hour(s))  Comprehensive metabolic panel     Status: Abnormal   Collection Time: 05/18/18  3:46 PM  Result Value Ref Range   Sodium 141 135 - 145 mmol/L   Potassium 4.1 3.5 - 5.1 mmol/L   Chloride 106 98 - 111 mmol/L   CO2 26 22 - 32 mmol/L   Glucose, Bld 119 (H) 70 - 99 mg/dL   BUN 15 8 - 23 mg/dL   Creatinine, Ser 0.49 0.44 - 1.00 mg/dL   Calcium 8.9 8.9 - 10.3 mg/dL   Total Protein 6.5 6.5 - 8.1 g/dL   Albumin 3.2 (L) 3.5 - 5.0 g/dL   AST 30 15 - 41 U/L   ALT 19 0 - 44 U/L   Alkaline Phosphatase 62 38 - 126 U/L   Total Bilirubin 0.5 0.3 - 1.2 mg/dL   GFR calc non Af Amer >60 >60 mL/min   GFR calc Af Amer >60 >60 mL/min     Comment: (NOTE) The eGFR has been calculated using the CKD EPI equation. This calculation has not been validated in all clinical situations. eGFR's persistently <60 mL/min signify possible Chronic Kidney Disease.    Anion gap 9 5 - 15    Comment: Performed at [facility], [address] [place], [place] [address]  CBC WITH DIFFERENTIAL     Status: Abnormal   Collection Time: 05/18/18  3:46 PM  Result Value Ref Range   WBC 5.2 4.0 - 10.5 K/uL   RBC 3.51 (L) 3.87 - 5.11 MIL/uL   Hemoglobin 11.5 (L) 12.0 - 15.0 g/dL   HCT 35.6 (L) 36.0 - 46.0 %   MCV 101.4 (H) 78.0 - 100.0 fL   MCH 32.8 26.0 - 34.0 pg   MCHC 32.3 30.0 - 36.0 g/dL   RDW 16.0 (H) 11.5 - 15.5 %   Platelets 252 150 - 400 K/uL   Neutrophils Relative % 84 %   Neutro Abs 4.3 1.7 - 7.7 K/uL   Lymphocytes Relative 8 %   Lymphs Abs 0.4 (L) 0.7 - 4.0 K/uL   Monocytes Relative 8 %   Monocytes Absolute 0.4 0.1 - 1.0 K/uL   Eosinophils Relative 0 %   Eosinophils Absolute 0.0 0.0 - 0.7 K/uL   Basophils Relative 0 %   Basophils Absolute 0.0 0.0 - 0.1 K/uL    Comment: Performed at [facility], [address] [place], [place] [address]  Protime-INR     Status: Abnormal   Collection Time: 05/18/18  3:46 PM  Result Value Ref Range   Prothrombin Time 23.1 (H) 11.4 - 15.2 seconds   INR 2.07     Comment: Performed at [facility], [address] [place], [place] [address]    No results found.  ROS: As above. PE:  Blood pressure 136/75, pulse 86, temperature 98 F (36.7 C), temperature source Oral, resp. rate 16, height 5' 5" (1.651 m), weight 54.5 kg, SpO2 99 %. Thin underweight appearing elderly female in no apparent distress.  Alert and oriented x4.  Mood and affect are normal.  Extraocular motions are intact.  Respirations are unlabored.  The left hip has a posterior lateral incision that is healed except for the proximal 2 cm.  This area has protruding subcutaneous  tissue and dark serosanguineous drainage.  No clots are evident.  There is mild erythema around the   proximal aspect of the incision.  More posteriorly at the sacrum is a large eschar measuring approximately 5 cm x 7 cm.  5 out of 5 strength at the quad and hamstring.  Normal sensibility to light touch throughout the left lower extremity.  Brisk capillary refill at the toes.  No lymphadenopathy.  Assessment/Plan: Left hip wound dehiscence and subcutaneous fluid collection -at this point the patient is on warfarin and has been started on heparin.  Per Dr. Tana Coast her INR will be allowed to drift down slowly.  Vitamin K is contraindicated given her mechanical heart valve.  She will continue on IV antibiotics with local wound care.  I have redressed her wound this evening but anticipate continued drainage.  Dr. Maureen Ralphs will take over in the morning.  She will likely need surgical debridement of the hematoma but this is unlikely to happen tomorrow.  She can continue with her regular diet pending further evaluation.  Wylene Simmer 05/18/2018, 6:15 PM

## 2018-05-18 NOTE — Progress Notes (Signed)
Pharmacy Antibiotic Note  Marissa Brown is a 77 y.o. female with hx St. 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 Her wound appeared to be getting worse and she was prescribed keflex by Dr. Maureen Ralphs on 8/30 (she took keflex starting on 8/31-9/2 AM) . Patient presented to [facility] ED on 9/2 for workup of wound.  CT at [facility] showed 20 cm abscess and she was transferred to [facility] on 9/2 for further workup and management.  To start vancomycin and zosyn for abscess.  - of note patient received vancomycin 1gm IV x1 at [facility] on 9/2 - labs from [facility] on 9/2: scr 0.55 - per pt, height= 65 inches, weight = 54 kg   Plan: - vancomycin 750 mg IV q24h for est AUC 445 - zosyn 3.375 gm IV q8h (infuse over 4 hrs)  _______________________________  Temp (24hrs), Avg:98 F (36.7 C), Min:98 F (36.7 C), Max:98 F (36.7 C)  No results for input(s): WBC, CREATININE, LATICACIDVEN, VANCOTROUGH, VANCOPEAK, VANCORANDOM, GENTTROUGH, GENTPEAK, GENTRANDOM, TOBRATROUGH, TOBRAPEAK, TOBRARND, AMIKACINPEAK, AMIKACINTROU, AMIKACIN in the last 168 hours.  CrCl cannot be calculated (Patient's most recent lab result is older than the maximum 21 days allowed.).    No Known Allergies   Thank you for allowing pharmacy to be a part of this patient's care.  Lynelle Doctor 05/18/2018 3:37 PM

## 2018-05-18 NOTE — Progress Notes (Signed)
[place] for heparin Indication: hx St. Jude AVR, CVA (home warfarin on hold)  No Known Allergies  Patient Measurements: per pt, height= 65 inches, weight = 54 kg   Heparin Dosing Weight: 54 kg  Vital Signs: Temp: 98 F (36.7 C) (09/02 1500) Temp Source: Oral (09/02 1500) BP: 136/75 (09/02 1500) Pulse Rate: 86 (09/02 1500)  Labs: Recent Labs    05/18/18 1546  HGB 11.5*  HCT 35.6*  PLT 252  LABPROT 23.1*  INR 2.07    CrCl cannot be calculated (Patient's most recent lab result is older than the maximum 21 days allowed.).   Medications:  PTA: warfarin 5 mg daily (last dose on 8/31)  Assessment: Patient's a 77 y/o F with hx St. Jude AVR and CVA on warfarin PTA and left hip surgery/replacement in July 2019, presented to [facility] from [place] on 9/2 for management of hip abscess.  Warfarin placed on hold-- To start heparin on 9/2 in case surgical intervention is needed.  - INR 2.07 - bleeding noted at left hip wound this morning on 9/2  Goal of Therapy:  Heparin level 0.3-0.7 units/ml; INR 2.5-3.5 (per outpt [facility] clinic note) Monitor platelets by anticoagulation protocol: Yes   Plan:  - start heparin drip at 750 units/hr (no bolus - per MD's request). - check 8 hr heparin level - monitor for severity of bleeding at wound site  Kendrix Orman P 05/18/2018,4:09 PM

## 2018-05-19 ENCOUNTER — Encounter (HOSPITAL_COMMUNITY): Payer: Self-pay | Admitting: *Deleted

## 2018-05-19 LAB — BASIC METABOLIC PANEL
Anion gap: 8 (ref 5–15)
BUN: 12 mg/dL (ref 8–23)
CHLORIDE: 110 mmol/L (ref 98–111)
CO2: 23 mmol/L (ref 22–32)
Calcium: 8.9 mg/dL (ref 8.9–10.3)
Creatinine, Ser: 0.48 mg/dL (ref 0.44–1.00)
GFR calc non Af Amer: >60 mL/min (ref >60)
Glucose, Bld: 97 mg/dL (ref 70–99)
POTASSIUM: 4 mmol/L (ref 3.5–5.1)
SODIUM: 141 mmol/L (ref 135–145)

## 2018-05-19 LAB — HEPARIN LEVEL (UNFRACTIONATED)
HEPARIN UNFRACTIONATED: 0.32 [IU]/mL (ref 0.30–0.70)
Heparin Unfractionated: <0.1 IU/mL — ABNORMAL LOW (ref 0.30–0.70)
Heparin Unfractionated: 0.13 IU/mL — ABNORMAL LOW (ref 0.30–0.70)

## 2018-05-19 LAB — PROTIME-INR
INR: 1.71
PROTHROMBIN TIME: 19.9 s — AB (ref 11.4–15.2)

## 2018-05-19 LAB — CBC
HEMATOCRIT: 33.3 % — AB (ref 36.0–46.0)
Hemoglobin: 10.7 g/dL — ABNORMAL LOW (ref 12.0–15.0)
MCH: 32.7 pg (ref 26.0–34.0)
MCHC: 32.1 g/dL (ref 30.0–36.0)
MCV: 101.8 fL — AB (ref 78.0–100.0)
PLATELETS: 263 10*3/uL (ref 150–400)
RBC: 3.27 MIL/uL — AB (ref 3.87–5.11)
RDW: 16.1 % — ABNORMAL HIGH (ref 11.5–15.5)
WBC: 4.3 10*3/uL (ref 4.0–10.5)

## 2018-05-19 MED ORDER — HEPARIN (PORCINE) IN NACL 100-0.45 UNIT/ML-% IJ SOLN
1150.0000 [IU]/h | INTRAMUSCULAR | Status: DC
  Administered 2018-05-19 (×2): 1150 [IU]/h via INTRAVENOUS
  Filled 2018-05-19: qty 250

## 2018-05-19 MED ORDER — SODIUM CHLORIDE 0.9 % IV SOLN
1.0000 g | Freq: Three times a day (TID) | INTRAVENOUS | Status: DC
  Administered 2018-05-19 – 2018-05-23 (×11): 1 g via INTRAVENOUS
  Filled 2018-05-19 (×13): qty 1

## 2018-05-19 MED ORDER — METOPROLOL TARTRATE 25 MG PO TABS
12.5000 mg | ORAL_TABLET | Freq: Two times a day (BID) | ORAL | Status: DC
  Administered 2018-05-19 – 2018-05-24 (×10): 12.5 mg via ORAL
  Filled 2018-05-19 (×12): qty 1

## 2018-05-19 MED ORDER — VANCOMYCIN HCL IN DEXTROSE 750-5 MG/150ML-% IV SOLN
750.0000 mg | INTRAVENOUS | Status: DC
  Administered 2018-05-19 – 2018-05-21 (×3): 750 mg via INTRAVENOUS
  Filled 2018-05-19 (×4): qty 150

## 2018-05-19 MED ORDER — VANCOMYCIN HCL IN DEXTROSE 750-5 MG/150ML-% IV SOLN: 750.0000 mg | INTRAVENOUS | Status: DC

## 2018-05-19 MED ORDER — JUVEN PO PACK
1.0000 | PACK | Freq: Two times a day (BID) | ORAL | Status: DC
  Administered 2018-05-19 – 2018-05-21 (×4): 1 via ORAL
  Filled 2018-05-19 (×11): qty 1

## 2018-05-19 MED ORDER — HEPARIN (PORCINE) IN NACL 100-0.45 UNIT/ML-% IJ SOLN: 950.0000 [IU]/h | INTRAMUSCULAR | Status: DC

## 2018-05-19 MED ORDER — VANCOMYCIN HCL IN DEXTROSE 1-5 GM/200ML-% IV SOLN
1000.0000 mg | Freq: Once | INTRAVENOUS | Status: DC
  Filled 2018-05-19: qty 200

## 2018-05-19 NOTE — Progress Notes (Signed)
Subjective: Patient states the drainage has slowed down significantly since last night. No complaints of pain left hip. No fevers or chills. Had one elevated temp over the weekend at home   Objective: Vital signs in last 24 hours: Temp:  [98 F (36.7 C)-99.7 F (37.6 C)] 98.4 F (36.9 C) (09/03 0522) Pulse Rate:  [73-91] 73 (09/03 0522) Resp:  [16-18] 18 (09/03 0522) BP: (136-156)/(60-83) 156/83 (09/03 0522) SpO2:  [94 %-99 %] 98 % (09/03 0522) Weight:  [54.1 kg-54.7 kg] 54.1 kg (09/03 0522)  Intake/Output from previous day: 09/02 0701 - 09/03 0700 In: 1292.3 [P.O.:120; I.V.:1072.3; IV Piggyback:100] Out: -  Intake/Output this shift: Total I/O In: 120 [P.O.:120] Out: -   Recent Labs    05/18/18 1546 05/19/18 0432  HGB 11.5* 10.7*   Recent Labs    05/18/18 1546 05/19/18 0432  WBC 5.2 4.3  RBC 3.51* 3.27*  HCT 35.6* 33.3*  PLT 252 263   Recent Labs    05/18/18 1546 05/19/18 0432  NA 141 141  K 4.1 4.0  CL 106 110  CO2 26 23  BUN 15 12  CREATININE 0.49 0.48  GLUCOSE 119* 97  CALCIUM 8.9 8.9   Recent Labs    05/18/18 1546 05/19/18 1027  INR 2.07 1.71    Neurologically intact Neurovascular intact No cellulitis present Has 1 x 1 cm area at top of incision which is draining bloody fluid. No surrounding erythema. No purulence. Eschar posterior and superior in buttock area but not communicating with the incision   Assessment/Plan: Left hip hematoma- I believe most of the fluid has drained out as there is no swelling around the incision. Unfortunately this open area needs to be addressed and I feel it would best be addressed with an irrigation and debridement and closure over drains. Will plan on doing this tomorrow afternoon. Discussed in detail with patient wo agrees with the plan and elects to proceed   Gaynelle Arabian 05/19/2018, 11:32 AM

## 2018-05-19 NOTE — Progress Notes (Signed)
Pharmacy Antibiotic Note  Marissa Brown is a 77 y.o. female with hx St. 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 Her wound appeared to be getting worse and she was prescribed keflex by Dr. Maureen Ralphs on 8/30 (she took keflex starting on 8/31-9/2 AM) . Patient presented to [facility] ED on 9/2 for workup of wound.  CT at [facility] showed 20 cm abscess and she was transferred to [facility] on 9/2 for further workup and management.  - of note patient received vancomycin 1gm IV x1 at [facility] on 9/2 - labs from [facility] on 9/2: scr 0.55 - per pt, height= 65 inches, weight = 54 kg   05/19/2018 - WBC 4.3, afebrile, Scr stable - To continue vancomycin and switch patient to cefepime to avoid renal impairment  Plan: - vancomycin 750 mg IV q24h for est AUC 445 - D/c zosyn 3.375 gm IV q8h (infuse over 4 hrs) - D/c daily Scr  - Initiate cefepime 1 g IV q8h     _______________________________  Temp (24hrs), Avg:98.7 F (37.1 C), Min:98 F (36.7 C), Max:99.7 F (37.6 C)  Recent Labs  Lab 05/18/18 1546 05/19/18 0432  WBC 5.2 4.3  CREATININE 0.49 0.48    Estimated Creatinine Clearance: 52.7 mL/min (by C-G formula based on SCr of 0.48 mg/dL).    No Known Allergies  Antimicrobials this admission:  9/2 zosyn>>9/3 9/3 cefepime>> 9/2 vanc>>  Dose adjustments this admission:   Microbiology results:  9/2 BCx2>>sent  Thank you for allowing pharmacy to be a part of this patient's care.  Quintella Baton 05/19/2018 10:20 AM

## 2018-05-19 NOTE — Progress Notes (Signed)
[place] for heparin Indication: hx St. Jude AVR, CVA (home warfarin on hold)  No Known Allergies  Patient Measurements: per pt, height= 65 inches, weight = 54 kg Height: 5\' 5"  (165.1 cm) Weight: 119 lb 4.3 oz (54.1 kg) IBW/kg (Calculated) : 57 Heparin Dosing Weight: 54 kg  Vital Signs: Temp: 98.2 F (36.8 C) (09/03 2128) Temp Source: Oral (09/03 2128) BP: 185/75 (09/03 2128) Pulse Rate: 78 (09/03 2128)  Labs: Recent Labs    05/18/18 1546 05/18/18 2311 05/19/18 0432 05/19/18 0952 05/19/18 1027 05/19/18 2101  HGB 11.5*  --  10.7*  --   --   --   HCT 35.6*  --  33.3*  --   --   --   PLT 252  --  263  --   --   --   LABPROT 23.1*  --   --   --  19.9*  --   INR 2.07  --   --   --  1.71  --   HEPARINUNFRC  --  <0.10*  --  0.13*  --  0.32  CREATININE 0.49  --  0.48  --   --   --     Estimated Creatinine Clearance: 52.7 mL/min (by C-G formula based on SCr of 0.48 mg/dL).   Medications:  PTA: warfarin 5 mg daily (last dose on 8/31)  Assessment: Patient's a 77 y/o F with hx St. Jude AVR and CVA on warfarin PTA and left hip surgery/replacement in July 2019, presented to [facility] from [place] on 9/2 for management of hip abscess.  Warfarin placed on hold-- To start heparin on 9/2 in case surgical intervention is needed.  05/19/2018  - INR 1.71  - 0952 HL = 0.13 ,below goal; no infusion issues, hip wound bleeding improved per RN  2nd shift update: - 2101 HL = 0.32 (therapeutic) with heparin infusing @ 1150 units/hr - no infusion complications of therapy noted  Goal of Therapy:  Heparin level 0.3-0.7 units/ml; INR 2.5-3.5 (per outpt [facility] clinic note) Monitor platelets by anticoagulation protocol: Yes   Plan:  -Continue heparin drip @ 1150 units/hr (no bolus - per MD's request). -repeat heparin level in 8 hours to confirm therapeutic dose - monitor for severity of bleeding at wound site  Vi Whitesel, Toribio Harbour, PharmD 05/19/2018,9:43  PM

## 2018-05-19 NOTE — Progress Notes (Signed)
[place] for heparin Indication: hx St. Jude AVR, CVA (home warfarin on hold)  No Known Allergies  Patient Measurements: per pt, height= 65 inches, weight = 54 kg Height: 5\' 5"  (165.1 cm) Weight: 120 lb 8 oz (54.7 kg) IBW/kg (Calculated) : 57 Heparin Dosing Weight: 54 kg  Vital Signs: Temp: 99.7 F (37.6 C) (09/02 2029) Temp Source: Oral (09/02 2029) BP: 143/60 (09/02 2029) Pulse Rate: 91 (09/02 2029)  Labs: Recent Labs    05/18/18 1546 05/18/18 2311  HGB 11.5*  --   HCT 35.6*  --   PLT 252  --   LABPROT 23.1*  --   INR 2.07  --   HEPARINUNFRC  --  <0.10*  CREATININE 0.49  --     Estimated Creatinine Clearance: 53.3 mL/min (by C-G formula based on SCr of 0.49 mg/dL).   Medications:  PTA: warfarin 5 mg daily (last dose on 8/31)  Assessment: Patient's a 77 y/o F with hx St. Jude AVR and CVA on warfarin PTA and left hip surgery/replacement in July 2019, presented to [facility] from [place] on 9/2 for management of hip abscess.  Warfarin placed on hold-- To start heparin on 9/2 in case surgical intervention is needed.  - INR 2.07 - bleeding noted at left hip wound this morning on 9/2 -2311 HL= <0.10 below goal, no infusion issues, hip wound bleeding improved per RN.  Goal of Therapy:  Heparin level 0.3-0.7 units/ml; INR 2.5-3.5 (per outpt [facility] clinic note) Monitor platelets by anticoagulation protocol: Yes   Plan:  -Increase heparin drip to 950 units/hr (no bolus - per MD's request). - check 8 hr heparin level - monitor for severity of bleeding at wound site  Lawana Pai R 05/19/2018,1:51 AM

## 2018-05-19 NOTE — Progress Notes (Addendum)
[place] for heparin Indication: hx St. Jude AVR, CVA (home warfarin on hold)  No Known Allergies  Patient Measurements: per pt, height= 65 inches, weight = 54 kg Height: 5\' 5"  (165.1 cm) Weight: 119 lb 4.3 oz (54.1 kg) IBW/kg (Calculated) : 57 Heparin Dosing Weight: 54 kg  Vital Signs: Temp: 98.4 F (36.9 C) (09/03 0522) Temp Source: Oral (09/03 0522) BP: 156/83 (09/03 0522) Pulse Rate: 73 (09/03 0522)  Labs: Recent Labs    05/18/18 1546 05/18/18 2311 05/19/18 0432 05/19/18 0952 05/19/18 1027  HGB 11.5*  --  10.7*  --   --   HCT 35.6*  --  33.3*  --   --   PLT 252  --  263  --   --   LABPROT 23.1*  --   --   --  19.9*  INR 2.07  --   --   --  1.71  HEPARINUNFRC  --  <0.10*  --  0.13*  --   CREATININE 0.49  --  0.48  --   --     Estimated Creatinine Clearance: 52.7 mL/min (by C-G formula based on SCr of 0.48 mg/dL).   Medications:  PTA: warfarin 5 mg daily (last dose on 8/31)  Assessment: Patient's a 77 y/o F with hx St. Jude AVR and CVA on warfarin PTA and left hip surgery/replacement in July 2019, presented to [facility] from [place] on 9/2 for management of hip abscess.  Warfarin placed on hold-- To start heparin on 9/2 in case surgical intervention is needed.  05/19/2018  - INR 1.71  - 0952 HL = 0.13 ,below goal; no infusion issues, hip wound bleeding improved per RN  Goal of Therapy:  Heparin level 0.3-0.7 units/ml; INR 2.5-3.5 (per outpt [facility] clinic note) Monitor platelets by anticoagulation protocol: Yes   Plan:  -Increase heparin drip to 1150 units/hr (no bolus - per MD's request). - check 8 hr heparin level - monitor for severity of bleeding at wound site  Quintella Baton 05/19/2018,12:13 PM

## 2018-05-19 NOTE — Progress Notes (Signed)
Initial Nutrition Assessment  DOCUMENTATION CODES:   Not applicable  INTERVENTION:   Juven Fruit Punch BID, each serving provides 95kcal and 2.5g of protein (amino acids glutamine and arginine)  NUTRITION DIAGNOSIS:   Increased nutrient needs related to wound healing as evidenced by estimated needs.  GOAL:   Patient will meet greater than or equal to 90% of their needs  MONITOR:   PO intake, Supplement acceptance, Weight trends, Labs  REASON FOR ASSESSMENT:   Consult Assessment of nutrition requirement/status  ASSESSMENT:   Patient with PMH significant for mechanical aortic valve on chronic warfarin, HTN, dyslipidemia, left knee replacement in 5/29, left hip surgery on 04/06/2018 after a mechanical fall at [place] this admission with abscess of left hip with large hematoma and chronic wound.    Pt endorses appetite was progressing since her knee replacement in May. States she would attempted to eat 3 meals a day that consisted of a sausage/egg/cheese biscuit with a moca frappe in the morning, a deli meat sandwich for lunch, and a meat/vegetable/grain for dinner. Per her reports she does not drink Ensure as she watches her Vitamin K intake closely due to being on chronic warfarin. She drinks muscle milk BID at home. Pt consumed a chicken and cheese quesadilla for lunch without complication. Discussed the importance of protein intake for preservation of lean body mass. Her husband is to bring muscle milk for pt to have while admitted. RD to provide Juven to promote wound healing (this does not contain Vitamin K).   Pt endorses a UBW of 145 lb and a 20 lb wt loss recently. Records indicate pt weighed  124 lb 01/19/18 and 119 lb this admission (4% wt loss in 4 months, insignificant loss for time frame). Nutrition-Focused physical exam completed.   Medications reviewed.  Labs reviewed.   NUTRITION - FOCUSED PHYSICAL EXAM:    Most Recent Value  Orbital Region   No depletion  Upper Arm Region  Moderate depletion  Thoracic and Lumbar Region  Unable to assess  Buccal Region  No depletion  Temple Region  Moderate depletion  Clavicle Bone Region  Moderate depletion  Clavicle and Acromion Bone Region  Mild depletion  Scapular Bone Region  Unable to assess  Dorsal Hand  Mild depletion  Patellar Region  Moderate depletion  Anterior Thigh Region  Moderate depletion  Posterior Calf Region  Moderate depletion  Edema (RD Assessment)  None     Diet Order:   Diet Order            Diet NPO time specified Except for: Sips with Meds  Diet effective 0500 tomorrow        Diet regular Room service appropriate? Yes; Fluid consistency: Thin  Diet effective now              EDUCATION NEEDS:   Education needs have been addressed  Skin:  Skin Assessment: Skin Integrity Issues: Skin Integrity Issues:: Incisions Incisions: left hip s/p hip repair  Last BM:  05/17/18  Height:   Ht Readings from Last 1 Encounters:  05/18/18 5\' 5"  (1.651 m)    Weight:   Wt Readings from Last 1 Encounters:  05/19/18 54.1 kg    Ideal Body Weight:  56.8 kg  BMI:  Body mass index is 19.85 kg/m.  Estimated Nutritional Needs:   Kcal:  1650-1850 kcal  Protein:  80-95 grams  Fluid:  >/= 1.6 L/day  Mariana Single RD, LDN Clinical Nutrition Pager # - [phone]

## 2018-05-19 NOTE — Progress Notes (Signed)
PROGRESS NOTE    Marissa Brown  VDI:[id number] DOB: August 07, 1941 DOA: 05/18/2018 PCP: Jonathon Resides, MD    Brief Narrative:  77 year old female with hypertension, hyperlipidemia, aortic valve disorder with mechanical AVR on Coumadin, left knee replacement in 5/29, left hip surgery on 04/06/2018 after a mechanical fall at [facility]  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  Patient was told by her home health nurse on Friday, 05/15/2018 that her wound looked worse.  Patient was seen by Dr. Wynelle Link and was placed on cephalexin.  Patient has been taking cephalexin until this morning.  However patient started having fevers, 102 F on Sunday.  Per patient, her husband has been assisting with the wound care at home.  This morning she woke up and she had bleeding from the left hip wound. Patient went to [facility] ED, where she was evaluated and found to have pus draining from the wound.  CT of the left hip showed 20 cm abscess per the ED physician report.  ED work-up/course: Per EDP, Dr. Patel at [facility], patient was afebrile, BP 131/56 heart rate 84 Lactate 1.4, INR 2.16 CT abd  Large, predominantly subcutaneous 19.7 cm abscess overlying the left gluteal muscles with extension to the skin surface and into the left gluteus maximus muscle  Assessment & Plan:   Principal Problem:   Abscess of left hip Active Problems:   OA (osteoarthritis) of knee   H/O heart valve replacement with mechanical valve   History of aortic valve disorder   Chronic back pain   Hyperlipidemia   Abscess of hip, left  Principal Problem:   Abscess of left hip with history of left hip surgery/replacement in 03/2018, complicated with large hematoma, eschar and chronic wound -CT abdomen pelvis at [facility] with findings of large predominantly subcutaneous  19.7 cm abscess overlying the left gluteal muscle with extension to the skin surface and into the left gluteus maximus muscle.  No clear communication with the left hip joint. -Orthopedics consulted, will likely need surgical debridement of hematoma -Coumadin on hold -Patient is continued on heparin gtt per pharmacy dosing -Initially continued on IV vancomycin and Zosyn. Will d/c zosyn and transition to cefepime to avoid ATN  Active Problems:   OA (osteoarthritis) of knee -Presently stable, no acute issues -Continue analgesics as needed  History of aortic valve disorder with heart valve replacement with mechanical valve -Presenting INR 2.16 -Coumadin on hold, continuing heparin gtt per above    Chronic back pain -Continue with analgesics as needed    Hyperlipidemia -Continue with pravastatin as tolerated  DVT prophylaxis: Heparin gtt Code Status: Full Family Communication: Pt in room, family at bedside Disposition Plan: Uncertain at this time  Consultants:   Orthopedic Surgery  Procedures:     Antimicrobials: Anti-infectives (From admission, onward)   Start     Dose/Rate Route Frequency Ordered Stop   05/20/18 1000  vancomycin (VANCOCIN) IVPB 750 mg/150 ml premix  Status:  Discontinued     750 mg 150 mL/hr over 60 Minutes Intravenous Every 24 hours 05/19/18 0802 05/19/18 1014   05/19/18 2000  ceFEPIme (MAXIPIME) 1 g in sodium chloride 0.9 % 100 mL IVPB     1 g 200 mL/hr over 30 Minutes Intravenous Every 8 hours 05/19/18 1052     09 /03/19 1200  vancomycin (VANCOCIN) IVPB 750  mg/150 ml premix  Status:  Discontinued     750 mg 150 mL/hr over 60 Minutes Intravenous Every 24 hours 05/18/18 1606 05/19/18 0802   05/19/18 1200  vancomycin (VANCOCIN) IVPB 750 mg/150 ml premix     750 mg 150 mL/hr over 60 Minutes Intravenous Every 24 hours 05/19/18 1014     05/19/18 0930  vancomycin (VANCOCIN) IVPB 1000 mg/200 mL premix  Status:  Discontinued     1,000 mg 200 mL/hr over  60 Minutes Intravenous  Once 05/19/18 0802 05/19/18 1014   05/18/18 1630  piperacillin-tazobactam (ZOSYN) IVPB 3.375 g  Status:  Discontinued     3.375 g 12.5 mL/hr over 240 Minutes Intravenous Every 8 hours 05/18/18 1606 05/19/18 0933       Subjective: Without complaints at this time  Objective: Vitals:   05/18/18 1600 05/18/18 1852 05/18/18 2029 05/19/18 0522  BP:   (!) 143/60 (!) 156/83  Pulse:   91 73  Resp:   16 18  Temp:   99.7 F (37.6 C) 98.4 F (36.9 C)  TempSrc:   Oral Oral  SpO2:   94% 98%  Weight: 54.5 kg 54.7 kg  54.1 kg  Height: 5\' 5"  (1.651 m)       Intake/Output Summary (Last 24 hours) at 05/19/2018 1126 Last data filed at 05/19/2018 1123 Gross per 24 hour  Intake 1412.31 ml  Output -  Net 1412.31 ml   Filed Weights   05/18/18 1600 05/18/18 1852 05/19/18 0522  Weight: 54.5 kg 54.7 kg 54.1 kg    Examination:  General exam: Appears calm and comfortable  Respiratory system: Clear to auscultation. Respiratory effort normal. Cardiovascular system: S1 & S2 heard, RRR.  Gastrointestinal system: Abdomen is nondistended, soft and nontender. No organomegaly or masses felt. Normal bowel sounds heard. Central nervous system: Alert and oriented. No focal neurological deficits. Extremities: Symmetric 5 x 5 power. Skin: No rashes, ulcers with eschar noted under L hip Psychiatry: Judgement and insight appear normal. Mood & affect appropriate.   Data Reviewed: I have personally reviewed following labs and imaging studies  CBC: Recent Labs  Lab 05/18/18 1546 05/19/18 0432  WBC 5.2 4.3  NEUTROABS 4.3  --   HGB 11.5* 10.7*  HCT 35.6* 33.3*  MCV 101.4* 101.8*  PLT 252 353   Basic Metabolic Panel: Recent Labs  Lab 05/18/18 1546 05/19/18 0432  NA 141 141  K 4.1 4.0  CL 106 110  CO2 26 23  GLUCOSE 119* 97  BUN 15 12  CREATININE 0.49 0.48  CALCIUM 8.9 8.9   GFR: Estimated Creatinine Clearance: 52.7 mL/min (by C-G formula based on SCr of 0.48  mg/dL). Liver Function Tests: Recent Labs  Lab 05/18/18 1546  AST 30  ALT 19  ALKPHOS 62  BILITOT 0.5  PROT 6.5  ALBUMIN 3.2*   No results for input(s): LIPASE, AMYLASE in the last 168 hours. No results for input(s): AMMONIA in the last 168 hours. Coagulation Profile: Recent Labs  Lab 05/18/18 1546 05/19/18 1027  INR 2.07 1.71   Cardiac Enzymes: No results for input(s): CKTOTAL, CKMB, CKMBINDEX, TROPONINI in the last 168 hours. BNP (last 3 results) No results for input(s): PROBNP in the last 8760 hours. HbA1C: No results for input(s): HGBA1C in the last 72 hours. CBG: No results for input(s): GLUCAP in the last 168 hours. Lipid Profile: No results for input(s): CHOL, HDL, LDLCALC, TRIG, CHOLHDL, LDLDIRECT in the last 72 hours. Thyroid Function Tests: No results for input(s): TSH, T4TOTAL,  FREET4, T3FREE, THYROIDAB in the last 72 hours. Anemia Panel: No results for input(s): VITAMINB12, FOLATE, FERRITIN, TIBC, IRON, RETICCTPCT in the last 72 hours. Sepsis Labs: No results for input(s): PROCALCITON, LATICACIDVEN in the last 168 hours.  No results found for this or any previous visit (from the past 240 hour(s)).   Radiology Studies: No results found.  Scheduled Meds: . metoprolol tartrate  12.5 mg Oral BID   Continuous Infusions: . sodium chloride 75 mL/hr at 05/19/18 0647  . ceFEPime (MAXIPIME) IV    . heparin 950 Units/hr (05/19/18 0202)  . vancomycin       LOS: 1 day   Marylu Lund, MD [facility] Pager On Amion  If 7PM-7AM, please contact night-coverage 05/19/2018, 11:26 AM

## 2018-05-20 ENCOUNTER — Inpatient Hospital Stay (HOSPITAL_COMMUNITY): Payer: Medicare Other | Admitting: Anesthesiology

## 2018-05-20 ENCOUNTER — Encounter (HOSPITAL_COMMUNITY)
Admission: AD | Disposition: A | Discharge status: Home Health | Payer: Self-pay | Source: Other Acute Inpatient Hospital | Attending: Internal Medicine

## 2018-05-20 DIAGNOSIS — M1712 Unilateral primary osteoarthritis, left knee: Secondary | ICD-10-CM

## 2018-05-20 DIAGNOSIS — E785 Hyperlipidemia, unspecified: Secondary | ICD-10-CM

## 2018-05-20 HISTORY — PX: INCISION AND DRAINAGE: SHX5863

## 2018-05-20 LAB — CBC
HEMATOCRIT: 35.6 % — AB (ref 36.0–46.0)
HEMOGLOBIN: 11.4 g/dL — AB (ref 12.0–15.0)
MCH: 32.4 pg (ref 26.0–34.0)
MCHC: 32 g/dL (ref 30.0–36.0)
MCV: 101.1 fL — AB (ref 78.0–100.0)
Platelets: 291 10*3/uL (ref 150–400)
RBC: 3.52 MIL/uL — ABNORMAL LOW (ref 3.87–5.11)
RDW: 15.7 % — ABNORMAL HIGH (ref 11.5–15.5)
WBC: 3.8 10*3/uL — AB (ref 4.0–10.5)

## 2018-05-20 LAB — SURGICAL PCR SCREEN
MRSA, PCR: NEGATIVE
STAPHYLOCOCCUS AUREUS: NEGATIVE

## 2018-05-20 LAB — HEPARIN LEVEL (UNFRACTIONATED): Heparin Unfractionated: 0.44 IU/mL (ref 0.30–0.70)

## 2018-05-20 SURGERY — INCISION AND DRAINAGE
Anesthesia: General | Site: Hip | Laterality: Left

## 2018-05-20 MED ORDER — PROPOFOL 10 MG/ML IV BOLUS
INTRAVENOUS | Status: DC | PRN
  Administered 2018-05-20: 40 mg via INTRAVENOUS
  Administered 2018-05-20: 120 mg via INTRAVENOUS

## 2018-05-20 MED ORDER — LIDOCAINE 2% (20 MG/ML) 5 ML SYRINGE
INTRAMUSCULAR | Status: DC | PRN
  Administered 2018-05-20: 50 mg via INTRAVENOUS

## 2018-05-20 MED ORDER — ONDANSETRON HCL 4 MG/2ML IJ SOLN: 4.0000 mg | Freq: Once | INTRAMUSCULAR | Status: DC | PRN

## 2018-05-20 MED ORDER — METOCLOPRAMIDE HCL 5 MG/ML IJ SOLN: 5.0000 mg | Freq: Three times a day (TID) | INTRAMUSCULAR | Status: DC | PRN

## 2018-05-20 MED ORDER — HYDRALAZINE HCL 20 MG/ML IJ SOLN
10.0000 mg | Freq: Three times a day (TID) | INTRAMUSCULAR | Status: DC | PRN
  Filled 2018-05-20: qty 1

## 2018-05-20 MED ORDER — FENTANYL CITRATE (PF) 100 MCG/2ML IJ SOLN
INTRAMUSCULAR | Status: AC
  Filled 2018-05-20: qty 2

## 2018-05-20 MED ORDER — DOCUSATE SODIUM 100 MG PO CAPS
100.0000 mg | ORAL_CAPSULE | Freq: Two times a day (BID) | ORAL | Status: DC
  Administered 2018-05-20 – 2018-05-24 (×7): 100 mg via ORAL
  Filled 2018-05-20 (×8): qty 1

## 2018-05-20 MED ORDER — ONDANSETRON HCL 4 MG PO TABS: 4.0000 mg | ORAL_TABLET | Freq: Four times a day (QID) | ORAL | Status: DC | PRN

## 2018-05-20 MED ORDER — ROCURONIUM BROMIDE 10 MG/ML (PF) SYRINGE
PREFILLED_SYRINGE | INTRAVENOUS | Status: DC | PRN
  Administered 2018-05-20: 30 mg via INTRAVENOUS

## 2018-05-20 MED ORDER — ONDANSETRON HCL 4 MG/2ML IJ SOLN: 4.0000 mg | Freq: Four times a day (QID) | INTRAMUSCULAR | Status: DC | PRN

## 2018-05-20 MED ORDER — WARFARIN - PHYSICIAN DOSING INPATIENT: Freq: Every day | Status: DC

## 2018-05-20 MED ORDER — LACTATED RINGERS IV SOLN
INTRAVENOUS | Status: DC
  Administered 2018-05-20: 18:00:00 via INTRAVENOUS

## 2018-05-20 MED ORDER — METOCLOPRAMIDE HCL 5 MG PO TABS: 5.0000 mg | ORAL_TABLET | Freq: Three times a day (TID) | ORAL | Status: DC | PRN

## 2018-05-20 MED ORDER — PROPOFOL 10 MG/ML IV BOLUS
INTRAVENOUS | Status: AC
  Filled 2018-05-20: qty 20

## 2018-05-20 MED ORDER — FENTANYL CITRATE (PF) 100 MCG/2ML IJ SOLN: 25.0000 ug | INTRAMUSCULAR | Status: DC | PRN

## 2018-05-20 MED ORDER — FENTANYL CITRATE (PF) 100 MCG/2ML IJ SOLN
INTRAMUSCULAR | Status: DC | PRN
  Administered 2018-05-20 (×4): 50 ug via INTRAVENOUS

## 2018-05-20 MED ORDER — EPHEDRINE SULFATE-NACL 50-0.9 MG/10ML-% IV SOSY
PREFILLED_SYRINGE | INTRAVENOUS | Status: DC | PRN
  Administered 2018-05-20: 10 mg via INTRAVENOUS

## 2018-05-20 MED ORDER — DEXAMETHASONE SODIUM PHOSPHATE 10 MG/ML IJ SOLN
INTRAMUSCULAR | Status: DC | PRN
  Administered 2018-05-20: 5 mg via INTRAVENOUS

## 2018-05-20 MED ORDER — ONDANSETRON HCL 4 MG/2ML IJ SOLN
INTRAMUSCULAR | Status: DC | PRN
  Administered 2018-05-20: 4 mg via INTRAVENOUS

## 2018-05-20 MED ORDER — EPHEDRINE 5 MG/ML INJ
INTRAVENOUS | Status: AC
  Filled 2018-05-20: qty 30

## 2018-05-20 MED ORDER — WARFARIN SODIUM 5 MG PO TABS
5.0000 mg | ORAL_TABLET | Freq: Every evening | ORAL | Status: DC
  Administered 2018-05-20: 5 mg via ORAL
  Filled 2018-05-20: qty 1

## 2018-05-20 MED ORDER — SUGAMMADEX SODIUM 200 MG/2ML IV SOLN
INTRAVENOUS | Status: DC | PRN
  Administered 2018-05-20: 150 mg via INTRAVENOUS

## 2018-05-20 MED ORDER — SODIUM CHLORIDE 0.9 % IR SOLN
Status: DC | PRN
  Administered 2018-05-20: 1000 mL

## 2018-05-20 MED ORDER — HEPARIN (PORCINE) IN NACL 100-0.45 UNIT/ML-% IJ SOLN
1150.0000 [IU]/h | INTRAMUSCULAR | Status: DC
  Administered 2018-05-21 – 2018-05-23 (×4): 1150 [IU]/h via INTRAVENOUS
  Filled 2018-05-20 (×5): qty 250

## 2018-05-20 SURGICAL SUPPLY — 45 items
BAG SPEC THK2 15X12 ZIP CLS (MISCELLANEOUS) ×1
BAG ZIPLOCK 12X15 (MISCELLANEOUS) ×3 IMPLANT
COVER SURGICAL LIGHT HANDLE (MISCELLANEOUS) ×3 IMPLANT
DRAPE INCISE IOBAN 85X60 (DRAPES) ×3 IMPLANT
DRAPE ORTHO SPLIT 77X108 STRL (DRAPES) ×6
DRAPE SURG 17X11 SM STRL (DRAPES) ×3 IMPLANT
DRAPE SURG ORHT 6 SPLT 77X108 (DRAPES) ×2 IMPLANT
DRAPE U-SHAPE 47X51 STRL (DRAPES) ×3 IMPLANT
DRSG ADAPTIC 3X8 NADH LF (GAUZE/BANDAGES/DRESSINGS) ×3 IMPLANT
DRSG MEPILEX BORDER 4X4 (GAUZE/BANDAGES/DRESSINGS) ×3 IMPLANT
DRSG MEPILEX BORDER 4X8 (GAUZE/BANDAGES/DRESSINGS) ×3 IMPLANT
DURAPREP 26ML APPLICATOR (WOUND CARE) ×3 IMPLANT
ELECT REM PT RETURN 15FT ADLT (MISCELLANEOUS) ×3 IMPLANT
EVACUATOR 1/8 PVC DRAIN (DRAIN) ×3 IMPLANT
GAUZE SPONGE 4X4 12PLY STRL (GAUZE/BANDAGES/DRESSINGS) ×3 IMPLANT
GLOVE BIO SURGEON STRL SZ7 (GLOVE) ×3 IMPLANT
GLOVE BIO SURGEON STRL SZ8 (GLOVE) ×3 IMPLANT
GLOVE BIOGEL PI IND STRL 6.5 (GLOVE) IMPLANT
GLOVE BIOGEL PI IND STRL 7.0 (GLOVE) ×1 IMPLANT
GLOVE BIOGEL PI IND STRL 7.5 (GLOVE) ×1 IMPLANT
GLOVE BIOGEL PI IND STRL 8 (GLOVE) ×2 IMPLANT
GLOVE BIOGEL PI INDICATOR 6.5 (GLOVE) ×2
GLOVE BIOGEL PI INDICATOR 7.0 (GLOVE) ×2
GLOVE BIOGEL PI INDICATOR 7.5 (GLOVE) ×2
GLOVE BIOGEL PI INDICATOR 8 (GLOVE) ×4
GLOVE SURG SS PI 6.5 STRL IVOR (GLOVE) ×2 IMPLANT
GOWN STRL REUS W/TWL LRG LVL3 (GOWN DISPOSABLE) ×5 IMPLANT
GOWN STRL REUS W/TWL XL LVL3 (GOWN DISPOSABLE) ×3 IMPLANT
HANDPIECE INTERPULSE COAX TIP (DISPOSABLE) ×3
KIT BASIN OR (CUSTOM PROCEDURE TRAY) ×3 IMPLANT
MANIFOLD NEPTUNE II (INSTRUMENTS) ×3 IMPLANT
PACK TOTAL JOINT (CUSTOM PROCEDURE TRAY) ×3 IMPLANT
POSITIONER SURGICAL ARM (MISCELLANEOUS) ×3 IMPLANT
SET HNDPC FAN SPRY TIP SCT (DISPOSABLE) ×1 IMPLANT
STAPLER VISISTAT 35W (STAPLE) ×3 IMPLANT
SUT MNCRL AB 4-0 PS2 18 (SUTURE) ×3 IMPLANT
SUT STRATAFIX 0 PDS 27 VIOLET (SUTURE) ×3
SUT VIC AB 1 CT1 27 (SUTURE) ×9
SUT VIC AB 1 CT1 27XBRD ANTBC (SUTURE) ×3 IMPLANT
SUT VIC AB 2-0 CT1 27 (SUTURE) ×9
SUT VIC AB 2-0 CT1 TAPERPNT 27 (SUTURE) ×3 IMPLANT
SUTURE STRATFX 0 PDS 27 VIOLET (SUTURE) ×1 IMPLANT
SWAB COLLECTION DEVICE MRSA (MISCELLANEOUS) ×3 IMPLANT
SWAB CULTURE ESWAB REG 1ML (MISCELLANEOUS) ×3 IMPLANT
TOWEL OR 17X26 10 PK STRL BLUE (TOWEL DISPOSABLE) ×6 IMPLANT

## 2018-05-20 NOTE — Anesthesia Preprocedure Evaluation (Signed)
Anesthesia Evaluation  Patient identified by MRN, date of birth, ID band Patient awake    Reviewed: Allergy & Precautions, NPO status , Patient's Chart, lab work & pertinent test results  Airway Mallampati: II  TM Distance: >3 FB Neck ROM: Full    Dental  (+) Teeth Intact, Dental Advisory Given   Pulmonary former smoker,    Pulmonary exam normal breath sounds clear to auscultation       Cardiovascular hypertension, Pt. on medications Normal cardiovascular exam+ Valvular Problems/Murmurs  Rhythm:Regular Rate:Normal  S/P AVR 1981, long term coumadin regimen  01/23/18 Echo: - LVEF 65-70%, mild LVH, normal wall motion, grade 1 DD, normal LV filling pressure, mechanical AVR with normal occluder motion and no obstruction, trivial MR, moderate LAE, normal RA size, mild TR, RVSP 36 mmHg, normal IVC.   Neuro/Psych negative neurological ROS  negative psych ROS   GI/Hepatic negative GI ROS, Neg liver ROS,   Endo/Other  negative endocrine ROS  Renal/GU negative Renal ROS     Musculoskeletal  (+) Arthritis , Osteoarthritis,  left hip hematoma; history of total hip arthroplasty   Abdominal   Peds  Hematology  (+) Blood dyscrasia (Warfarin; heparin gtt as inpatient), anemia , INR 0.9 on 01/19/18   Anesthesia Other Findings Day of surgery medications reviewed with the patient.  Reproductive/Obstetrics                             Anesthesia Physical  Anesthesia Plan  ASA: III  Anesthesia Plan: General   Post-op Pain Management:    Induction:   PONV Risk Score and Plan: 3 and Propofol infusion, Dexamethasone and Ondansetron  Airway Management Planned: Oral ETT  Additional Equipment:   Intra-op Plan:   Post-operative Plan: Extubation in OR  Informed Consent: I have reviewed the patients History and Physical, chart, labs and discussed the procedure including the risks, benefits and alternatives for  the proposed anesthesia with the patient or authorized representative who has indicated his/her understanding and acceptance.   Dental advisory given  Plan Discussed with: CRNA, Anesthesiologist and Surgeon  Anesthesia Plan Comments:         Anesthesia Quick Evaluation

## 2018-05-20 NOTE — Op Note (Signed)
NAME: MERISA, JULIO. MEDICAL RECORD [id number] ACCOUNT [id number] DATE OF BIRTH:Jul 10, 1941 FACILITY: [facility] LOCATION: [facility] PHYSICIAN:Chanze Teagle Zella Ball, MD  OPERATIVE REPORT  DATE OF PROCEDURE:  05/20/2018  PREOPERATIVE DIAGNOSIS:  Hematoma, left hip.  POSTOPERATIVE DIAGNOSIS:  Hematoma, left hip.  PROCEDURE:  Irrigation and debridement, left hip hematoma.  SURGEON:  Gaynelle Arabian, MD  ASSISTANT:  None.  ANESTHESIA:  General.  ESTIMATED BLOOD LOSS:  50 mL.  DRAINS:  Hemovac x1.  COMPLICATIONS:  None.  CONDITION:  Stable to recovery.  BRIEF CLINICAL NOTE:  The patient is a 77 year old female who had a left hip arthroplasty procedure done in [place], [place], after a fall and a fracture.  She resides in [place] and was following up with me for postoperative care.  She  had an eschar and decubitus on her buttock upon presentation initially.  She developed swelling and then drainage from her hip incision 2 days ago.  She was admitted to the hospital and placed on IV antibiotics.  She has a hematoma present, and the  posterosuperior part of her wound had opened up.  She presents now for irrigation, debridement and closure over drains.  PROCEDURE IN DETAIL:  After successful administration of general anesthetic, the patient was placed in a right lateral decubitus position with the left side up and held with a hip positioner.  Left lower extremity was isolated from the perineum with  plastic drapes and prepped and draped in the usual sterile fashion. Her previous incision was utilized.  Skin cut with a 10 blade through subcutaneous tissue.  The area that was opened was ellipsed out.  Unfortunately, she had significant muscle damage  in her gluteal muscles from the exposure she had for her surgery.  I had to debride some abnormal-appearing muscle.  There was also a large hematoma present in the deep muscular tissue.  Fortunately, it was not subfascial, and she did not  have any  communication with the hip joint.  I thoroughly wash this out with 3 L of saline using pulsatile lavage.  It does seem, however, that this tracks to the area where her decubitus was present.  The deep tissues were subsequently closed over a drain with a  Stratafix suture and with #1 Vicryl.  Subcu was then closed with interrupted 2-0 Vicryl and skin with staples.  Drain was hooked to suction.  Incision cleaned and dried and a bulky sterile dressing applied.  She was then awakened and transported to  recovery in stable condition.  LN/NUANCE  D:05/20/2018 T:05/20/2018 JOB:[id number]

## 2018-05-20 NOTE — Progress Notes (Addendum)
Pt BP 172/81, hand off report to [facility] in Short stay, will recheck BP and treat. Pt transferred to surgery. Family at bedside, pt in good spirits, alert and oriented. Denies pain. ABT hung prior to transfer. SRP, RN

## 2018-05-20 NOTE — Brief Op Note (Signed)
05/20/2018  7:17 PM  PATIENT:  Beola Cord  77 y.o. female  PRE-OPERATIVE DIAGNOSIS:  left hip hematoma; history of total hip arthroplasty  POST-OPERATIVE DIAGNOSIS:  left hip hematoma; history of total hip arthroplasty  PROCEDURE:  Procedure(s) with comments: INCISION AND DRAINAGE LEFT HIP HEMATOMA (Left) - 13min  SURGEON:  Surgeon(s) and Role:    Gaynelle Arabian, MD - Primary  PHYSICIAN ASSISTANT:   ASSISTANTS: none   ANESTHESIA:   general  EBL:  50 mL   BLOOD ADMINISTERED:none  DRAINS: (Medium) Hemovact drain(s) in the left hip with  Suction Open   LOCAL MEDICATIONS USED:  NONE  COUNTS:  YES  TOURNIQUET:  * No tourniquets in log *  DICTATION: .Other Dictation: Dictation Number [id number]  PLAN OF CARE: Admit to inpatient   PATIENT DISPOSITION:  PACU - hemodynamically stable.

## 2018-05-20 NOTE — Anesthesia Procedure Notes (Signed)
Procedure Name: Intubation Date/Time: 05/20/2018 6:13 PM Performed by: Anne Fu, CRNA Pre-anesthesia Checklist: Patient identified, Emergency Drugs available, Suction available, Patient being monitored and Timeout performed Patient Re-evaluated:Patient Re-evaluated prior to induction Oxygen Delivery Method: Circle system utilized Preoxygenation: Pre-oxygenation with 100% oxygen Induction Type: IV induction Ventilation: Mask ventilation without difficulty Laryngoscope Size: Mac and 4 Grade View: Grade I Tube type: Oral Tube size: 7.5 mm Number of attempts: 1 Airway Equipment and Method: Stylet Placement Confirmation: ETT inserted through vocal cords under direct vision,  positive ETCO2 and breath sounds checked- equal and bilateral Secured at: 23 cm Tube secured with: Tape Dental Injury: Teeth and Oropharynx as per pre-operative assessment

## 2018-05-20 NOTE — Transfer of Care (Signed)
Immediate Anesthesia Transfer of Care Note  Patient: Marissa Brown  Procedure(s) Performed: INCISION AND DRAINAGE LEFT HIP HEMATOMA (Left Hip)  Patient Location: PACU  Anesthesia Type:General  Level of Consciousness: sedated, drowsy, patient cooperative and responds to stimulation  Airway & Oxygen Therapy: Patient Spontanous Breathing and Patient connected to face mask oxygen  Post-op Assessment: Report given to RN and Post -op Vital signs reviewed and stable  Post vital signs: Reviewed and stable  Last Vitals:  Vitals Value Taken Time  BP    Temp    Pulse 83 05/20/2018  7:16 PM  Resp    SpO2 100 % 05/20/2018  7:16 PM  Vitals shown include unvalidated device data.  Last Pain:  Vitals:   05/20/18 1733  TempSrc: Oral  PainSc: 0-No pain         Complications: No apparent anesthesia complications

## 2018-05-20 NOTE — Anesthesia Postprocedure Evaluation (Signed)
Anesthesia Post Note  Patient: DEMARA LOVER  Procedure(s) Performed: INCISION AND DRAINAGE LEFT HIP HEMATOMA (Left Hip)     Patient location during evaluation: PACU Anesthesia Type: General Level of consciousness: awake and alert, awake and oriented Pain management: pain level controlled Vital Signs Assessment: post-procedure vital signs reviewed and stable Respiratory status: spontaneous breathing, nonlabored ventilation and respiratory function stable Cardiovascular status: blood pressure returned to baseline and stable Postop Assessment: no apparent nausea or vomiting Anesthetic complications: no    Last Vitals:  Vitals:   05/20/18 1930 05/20/18 1945  BP: (!) 141/76 120/73  Pulse: (!) 41 74  Resp: 20 20  Temp:  36.8 C  SpO2: 100% 97%    Last Pain:  Vitals:   05/20/18 1945  TempSrc:   PainSc: 3                  Catalina Gravel

## 2018-05-20 NOTE — Progress Notes (Signed)
PROGRESS NOTE  Marissa Brown MPN:[id number] DOB: 30-Mar-1941 DOA: 05/18/2018 PCP: Jonathon Resides, MD  HPI/Recap of past 24 hours: 77 year old female with hx of hypertension, hyperlipidemia, aortic valve disorder with mechanical AVR on Coumadin, left knee replacement in 5/29, left hip surgery on 04/06/2018 after a mechanical fall at [facility]. 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 Patient was told by her home health nurse on Friday, 05/15/2018 that her wound looked worse. Patient was seen by Dr. Rodney Cruise was placed on cephalexin.Patient has been taking cephalexin until presentation to ED, however noted fevers as high as 102 F at home. Pt also noted some bleeding from the left hip wound. Patient went to [facility] ED, where she was evaluated and found to have pus draining from the wound.CT of the left hip showed 20 cm abscess per the ED physician report. Pt admitted for further management.  Today, pt reported feeling tired, denies any fever/chills, abdominal pain, chest pain, SOB. Minimal oozing noted from chronic L hip wound. For I &D today   Assessment/Plan: Principal Problem:   Abscess of left hip Active Problems:   OA (osteoarthritis) of knee   H/O heart valve replacement with mechanical valve   History of aortic valve disorder   Chronic back pain   Hyperlipidemia   Abscess of hip, left  Abscess of left hip with history of left hip surgery/replacement in 0/0867, complicated with large hematoma, eschar and chronic wound Afebrile, with no leukocytosis BC X 2 NGTD CT abdomen pelvis at [facility] hospital with findings of large predominantly subcutaneous 19.7 cm abscess overlying the left gluteal muscle with extension to the skin surface and into the left gluteus maximus muscle. No clear communication with the left hip  joint. Orthopedics on board: Plan for I&D today Continue IV Vanc, Cefepime Continue IV heparin, hold today prior to I&D Monitor closely  HTN Uncontrolled Continue lopressor, start IV hydralazine prn  History of aortic valve disorder s/p mechanical valve replacement Coumadin on hold, continuing heparin gtt per above  OA (osteoarthritis) of knee Presently stable, no acute issues Continue analgesics as needed  Chronic back pain Continue with analgesics as needed  Hyperlipidemia Continue statins    Code Status: Full  Family Communication: None at bedside  Disposition Plan: Once management complete   Consultants:  Orthopedics  Procedures:  None  Antimicrobials:  Cefepime  Vancomycin  DVT prophylaxis: Heparin drip   Objective: Vitals:   05/20/18 0452 05/20/18 1338 05/20/18 1501 05/20/18 1713  BP: (!) 176/75  (!) 174/116 (!) 172/81  Pulse: 68  67 73  Resp: 20  17   Temp: 99 F (37.2 C)  98.3 F (36.8 C)   TempSrc: Oral  Oral   SpO2: 97% 97% 97%   Weight:      Height:        Intake/Output Summary (Last 24 hours) at 05/20/2018 1714 Last data filed at 05/20/2018 1031 Gross per 24 hour  Intake 1162.23 ml  Output 900 ml  Net 262.23 ml   Filed Weights   05/18/18 1600 05/18/18 1852 05/19/18 0522  Weight: 54.5 kg 54.7 kg 54.1 kg    Exam:   General: NAD  Cardiovascular: S1, S2 present  Respiratory: CTA B  Abdomen: Soft, nontender, nondistended, bowel sounds present  Musculoskeletal: No pedal edema bilaterally  Skin: Noted wound with eschar on the  left hip, with some drainage noted  Psychiatry: Normal mood   Data Reviewed: CBC: Recent Labs  Lab 05/18/18 1546 05/19/18 0432 05/20/18 0445  WBC 5.2 4.3 3.8*  NEUTROABS 4.3  --   --   HGB 11.5* 10.7* 11.4*  HCT 35.6* 33.3* 35.6*  MCV 101.4* 101.8* 101.1*  PLT 252 263 474   Basic Metabolic Panel: Recent Labs  Lab 05/18/18 1546 05/19/18 0432  NA 141 141  K 4.1 4.0  CL 106 110   CO2 26 23  GLUCOSE 119* 97  BUN 15 12  CREATININE 0.49 0.48  CALCIUM 8.9 8.9   GFR: Estimated Creatinine Clearance: 52.7 mL/min (by C-G formula based on SCr of 0.48 mg/dL). Liver Function Tests: Recent Labs  Lab 05/18/18 1546  AST 30  ALT 19  ALKPHOS 62  BILITOT 0.5  PROT 6.5  ALBUMIN 3.2*   No results for input(s): LIPASE, AMYLASE in the last 168 hours. No results for input(s): AMMONIA in the last 168 hours. Coagulation Profile: Recent Labs  Lab 05/18/18 1546 05/19/18 1027  INR 2.07 1.71   Cardiac Enzymes: No results for input(s): CKTOTAL, CKMB, CKMBINDEX, TROPONINI in the last 168 hours. BNP (last 3 results) No results for input(s): PROBNP in the last 8760 hours. HbA1C: No results for input(s): HGBA1C in the last 72 hours. CBG: No results for input(s): GLUCAP in the last 168 hours. Lipid Profile: No results for input(s): CHOL, HDL, LDLCALC, TRIG, CHOLHDL, LDLDIRECT in the last 72 hours. Thyroid Function Tests: No results for input(s): TSH, T4TOTAL, FREET4, T3FREE, THYROIDAB in the last 72 hours. Anemia Panel: No results for input(s): VITAMINB12, FOLATE, FERRITIN, TIBC, IRON, RETICCTPCT in the last 72 hours. Urine analysis:    Component Value Date/Time   COLORURINE STRAW (A) 01/22/2018 0649   APPEARANCEUR CLEAR 01/22/2018 0649   LABSPEC 1.008 01/22/2018 0649   PHURINE 7.0 01/22/2018 0649   GLUCOSEU NEGATIVE 01/22/2018 0649   HGBUR SMALL (A) 01/22/2018 0649   BILIRUBINUR NEGATIVE 01/22/2018 0649   KETONESUR NEGATIVE 01/22/2018 0649   PROTEINUR NEGATIVE 01/22/2018 0649   NITRITE NEGATIVE 01/22/2018 0649   LEUKOCYTESUR TRACE (A) 01/22/2018 0649   Sepsis Labs: @LABRCNTIP (procalcitonin:4,lacticidven:4)  ) Recent Results (from the past 240 hour(s))  Culture, blood (routine x 2)     Status: None (Preliminary result)   Collection Time: 05/18/18  4:17 PM  Result Value Ref Range Status   Specimen Description   Final    BLOOD LEFT ARM Performed at [facility], [place] [address], [place], [place] [address]    Special Requests   Final    BOTTLES DRAWN AEROBIC AND ANAEROBIC Blood Culture adequate volume Performed at [facility] [address], [place], [place] [address]    Culture   Final    NO GROWTH 2 DAYS Performed at [facility] [address], [place], [place] [address]    Report Status PENDING  Incomplete  Culture, blood (routine x 2)     Status: None (Preliminary result)   Collection Time: 05/18/18  4:26 PM  Result Value Ref Range Status   Specimen Description   Final    BLOOD RIGHT ARM Performed at [facility] [address], [place], [place] [address]    Special Requests   Final    BOTTLES DRAWN AEROBIC AND ANAEROBIC Blood Culture adequate volume Performed at [facility] [place] [address], [place],  [address]    Culture   Final  NO GROWTH 2 DAYS Performed at [facility], [place] [address] [place], [place] [address]    Report Status PENDING  Incomplete  Surgical pcr screen     Status: None   Collection Time: 05/20/18  8:39 AM  Result Value Ref Range Status   MRSA, PCR NEGATIVE NEGATIVE Final   Staphylococcus aureus NEGATIVE NEGATIVE Final    Comment: (NOTE) The Xpert SA Assay (FDA approved for NASAL specimens in patients 76 years of age and older), is one component of a comprehensive surveillance program. It is not intended to diagnose infection nor to guide or monitor treatment. Performed at [facility], [place] [address] [place], [place] [address]       Studies: No results found.  Scheduled Meds: . metoprolol tartrate  12.5 mg Oral BID  . nutrition supplement (JUVEN)  1 packet Oral BID BM    Continuous Infusions: . sodium chloride 75 mL/hr at 05/20/18 0600  . ceFEPime (MAXIPIME) IV 1 g (05/20/18 1712)  . vancomycin 750 mg (05/20/18 1207)     LOS: 2 days     Alma Friendly,  MD [facility]   If 7PM-7AM, please contact night-coverage [email or web address] Password [email or web address] 05/20/2018, 5:14 PM

## 2018-05-20 NOTE — Progress Notes (Addendum)
Subjective: No complaints of hip pain. Said dressing was changed last night   Objective: Vital signs in last 24 hours: Temp:  [98.2 F (36.8 C)-99 F (37.2 C)] 99 F (37.2 C) (09/04 0452) Pulse Rate:  [68-78] 68 (09/04 0452) Resp:  [18-20] 20 (09/04 0452) BP: (147-185)/(63-75) 176/75 (09/04 0452) SpO2:  [94 %-97 %] 97 % (09/04 0452)  Intake/Output from previous day: 09/03 0701 - 09/04 0700 In: 2240.7 [P.O.:120; I.V.:1770.7; IV Piggyback:350] Out: -  Intake/Output this shift: No intake/output data recorded.  Recent Labs    05/18/18 1546 05/19/18 0432 05/20/18 0445  HGB 11.5* 10.7* 11.4*   Recent Labs    05/19/18 0432 05/20/18 0445  WBC 4.3 3.8*  RBC 3.27* 3.52*  HCT 33.3* 35.6*  PLT 263 291   Recent Labs    05/18/18 1546 05/19/18 0432  NA 141 141  K 4.1 4.0  CL 106 110  CO2 26 23  BUN 15 12  CREATININE 0.49 0.48  GLUCOSE 119* 97  CALCIUM 8.9 8.9   Recent Labs    05/18/18 1546 05/19/18 1027  INR 2.07 1.71    Neurologically intact No cellulitis present Compartment soft Still with small amount bloody drainage from top of incision  Assessment/Plan: Left hip hematoma- Proceed with surgery this afternoon as planned. Discontinue heparin at 10 AM   Pilar Plate Makyla Bye 05/20/2018, 7:32 AM

## 2018-05-20 NOTE — Progress Notes (Addendum)
[place] for heparin Indication: hx St. Jude AVR, CVA (home warfarin on hold)  No Known Allergies  Patient Measurements:  Height: 5\' 5"  (165.1 cm) Weight: 119 lb 4.3 oz (54.1 kg) IBW/kg (Calculated) : 57 Heparin Dosing Weight: 54 kg  Vital Signs: Temp: 98.3 F (36.8 C) (09/04 1501) Temp Source: Oral (09/04 1733) BP: 189/76 (09/04 1733) Pulse Rate: 69 (09/04 1733)  Labs: Recent Labs    05/18/18 1546  05/19/18 0432 05/19/18 0952 05/19/18 1027 05/19/18 2101 05/20/18 0445  HGB 11.5*  --  10.7*  --   --   --  11.4*  HCT 35.6*  --  33.3*  --   --   --  35.6*  PLT 252  --  263  --   --   --  291  LABPROT 23.1*  --   --   --  19.9*  --   --   INR 2.07  --   --   --  1.71  --   --   HEPARINUNFRC  --    < >  --  0.13*  --  0.32 0.44  CREATININE 0.49  --  0.48  --   --   --   --    < > = values in this interval not displayed.    Estimated Creatinine Clearance: 52.7 mL/min (by C-G formula based on SCr of 0.48 mg/dL).  Assessment: Patient's a 77 y/o F with hx St. Jude AVR and CVA on warfarin PTA and left hip surgery/replacement in July 2019, presented to [facility] from [place] on 9/2 for management of hip abscess.  Warfarin placed on hold.  Pharmacy consulted to dose heparin.  Today, 05/20/2018:   0445 HL = 0.44 ,therapeutic on heparin at 1150 unit/hr CBC: Hgb 11.4, Plt 291   Goal of Therapy:  Heparin level 0.3-0.7 units/ml; INR 2.5-3.5 (per outpt [facility] clinic note) Monitor platelets by anticoagulation protocol: Yes   Plan:  - D/c heparin drip at 10 AM prior to surgery - Verbal order from Dr. Wynelle Link to resume heparin on 9/5 at 0600 (POD1) - No heparin bolus per previous MD request. - Heparin to begin at 1150 units/hr (previously therapeutic on this rate). - f/u plans to resume warfarin  Gretta Arab PharmD, BCPS Pager [phone] 05/20/2018 6:51 PM    Addendum: Warfarin 5mg  PO daily ordered per MD. Begin on 9/4 (POD0)  Gretta Arab PharmD, BCPS Pager [phone] 05/20/2018 8:28 PM

## 2018-05-20 NOTE — Progress Notes (Signed)
[place] for heparin Indication: hx St. Jude AVR, CVA (home warfarin on hold)  No Known Allergies  Patient Measurements: per pt, height= 65 inches, weight = 54 kg Height: 5\' 5"  (165.1 cm) Weight: 119 lb 4.3 oz (54.1 kg) IBW/kg (Calculated) : 57 Heparin Dosing Weight: 54 kg  Vital Signs: Temp: 99 F (37.2 C) (09/04 0452) Temp Source: Oral (09/04 0452) BP: 176/75 (09/04 0452) Pulse Rate: 68 (09/04 0452)  Labs: Recent Labs    05/18/18 1546  05/19/18 0432 05/19/18 0952 05/19/18 1027 05/19/18 2101 05/20/18 0445  HGB 11.5*  --  10.7*  --   --   --  11.4*  HCT 35.6*  --  33.3*  --   --   --  35.6*  PLT 252  --  263  --   --   --  291  LABPROT 23.1*  --   --   --  19.9*  --   --   INR 2.07  --   --   --  1.71  --   --   HEPARINUNFRC  --    < >  --  0.13*  --  0.32 0.44  CREATININE 0.49  --  0.48  --   --   --   --    < > = values in this interval not displayed.    Estimated Creatinine Clearance: 52.7 mL/min (by C-G formula based on SCr of 0.48 mg/dL).   Medications:  PTA: warfarin 5 mg daily (last dose on 8/31)  Assessment: Patient's a 77 y/o F with hx St. Jude AVR and CVA on warfarin PTA and left hip surgery/replacement in July 2019, presented to [facility] from [place] on 9/2 for management of hip abscess.  Warfarin placed on hold-- To start heparin on 9/2 in case surgical intervention is needed.  05/20/2018  - 0445 HL = 0.44 ,therapeutic; no infusion issues, small amount of bloody drainage from top of incision - Hgb 11.4, Plt 291, Scr stable  Goal of Therapy:  Heparin level 0.3-0.7 units/ml; INR 2.5-3.5 (per outpt [facility] clinic note) Monitor platelets by anticoagulation protocol: Yes   Plan:  - Surgery scheduled this afternoon - D/c heparin drip at 10 AM for surgery - Follow up post-op regarding restarting heparin/warfarin  Quintella Baton, PharmD 05/20/2018,7:46 AM

## 2018-05-21 ENCOUNTER — Encounter (HOSPITAL_COMMUNITY): Payer: Self-pay | Admitting: Orthopedic Surgery

## 2018-05-21 LAB — HEPARIN LEVEL (UNFRACTIONATED): Heparin Unfractionated: 0.34 IU/mL (ref 0.30–0.70)

## 2018-05-21 LAB — CBC WITH DIFFERENTIAL/PLATELET
BASOS PCT: 0 %
Basophils Absolute: 0 10*3/uL (ref 0.0–0.1)
EOS ABS: 0 10*3/uL (ref 0.0–0.7)
Eosinophils Relative: 0 %
HEMATOCRIT: 37.7 % (ref 36.0–46.0)
HEMOGLOBIN: 12.1 g/dL (ref 12.0–15.0)
Lymphocytes Relative: 10 %
Lymphs Abs: 0.5 10*3/uL — ABNORMAL LOW (ref 0.7–4.0)
MCH: 32.4 pg (ref 26.0–34.0)
MCHC: 32.1 g/dL (ref 30.0–36.0)
MCV: 100.8 fL — ABNORMAL HIGH (ref 78.0–100.0)
Monocytes Absolute: 0.1 10*3/uL (ref 0.1–1.0)
Monocytes Relative: 2 %
NEUTROS ABS: 4.2 10*3/uL (ref 1.7–7.7)
NEUTROS PCT: 88 %
Platelets: 339 10*3/uL (ref 150–400)
RBC: 3.74 MIL/uL — AB (ref 3.87–5.11)
RDW: 15.6 % — ABNORMAL HIGH (ref 11.5–15.5)
WBC: 4.8 10*3/uL (ref 4.0–10.5)

## 2018-05-21 LAB — PROTIME-INR
INR: 1.15
PROTHROMBIN TIME: 14.6 s (ref 11.4–15.2)

## 2018-05-21 LAB — BASIC METABOLIC PANEL
Anion gap: 10 (ref 5–15)
BUN: 9 mg/dL (ref 8–23)
CHLORIDE: 103 mmol/L (ref 98–111)
CO2: 24 mmol/L (ref 22–32)
Calcium: 9.5 mg/dL (ref 8.9–10.3)
Creatinine, Ser: 0.47 mg/dL (ref 0.44–1.00)
GFR calc non Af Amer: >60 mL/min (ref >60)
Glucose, Bld: 117 mg/dL — ABNORMAL HIGH (ref 70–99)
POTASSIUM: 4.2 mmol/L (ref 3.5–5.1)
SODIUM: 137 mmol/L (ref 135–145)

## 2018-05-21 MED ORDER — WARFARIN - PHARMACIST DOSING INPATIENT
Freq: Every day | Status: DC
  Administered 2018-05-23: 17:00:00

## 2018-05-21 MED ORDER — DIPHENHYDRAMINE HCL 25 MG PO CAPS
25.0000 mg | ORAL_CAPSULE | Freq: Once | ORAL | Status: AC
  Administered 2018-05-21: 25 mg via ORAL
  Filled 2018-05-21: qty 1

## 2018-05-21 MED ORDER — WARFARIN SODIUM 5 MG PO TABS
7.5000 mg | ORAL_TABLET | Freq: Once | ORAL | Status: AC
  Administered 2018-05-21: 7.5 mg via ORAL
  Filled 2018-05-21: qty 1

## 2018-05-21 MED ORDER — HYDROCODONE-ACETAMINOPHEN 5-325 MG PO TABS: 1.0000 | ORAL_TABLET | Freq: Four times a day (QID) | ORAL | 0 refills | Status: AC | PRN

## 2018-05-21 NOTE — Progress Notes (Signed)
[place] for heparin and warfarin Indication: hx St. Jude AVR, CVA   No Known Allergies  Patient Measurements:  Height: 5\' 5"  (165.1 cm) Weight: 119 lb 4.3 oz (54.1 kg) IBW/kg (Calculated) : 57 Heparin Dosing Weight: 54 kg  Vital Signs: Temp: 98.1 F (36.7 C) (09/05 0418) BP: 138/58 (09/05 0418) Pulse Rate: 67 (09/05 0418)  Labs: Recent Labs    05/18/18 1546  05/19/18 0432 05/19/18 0952 05/19/18 1027 05/19/18 2101 05/20/18 0445 05/21/18 0441  HGB 11.5*  --  10.7*  --   --   --  11.4* 12.1  HCT 35.6*  --  33.3*  --   --   --  35.6* 37.7  PLT 252  --  263  --   --   --  291 339  LABPROT 23.1*  --   --   --  19.9*  --   --  14.6  INR 2.07  --   --   --  1.71  --   --  1.15  HEPARINUNFRC  --    < >  --  0.13*  --  0.32 0.44  --   CREATININE 0.49  --  0.48  --   --   --   --  0.47   < > = values in this interval not displayed.    Estimated Creatinine Clearance: 52.7 mL/min (by C-G formula based on SCr of 0.47 mg/dL).  Assessment: Patient's a 77 y.o F with hx St. Jude AVR and CVA on warfarin PTA and left hip surgery/replacement in July 2019, presented to [facility] from [place] on 9/2 for management of hip abscess. Warfarin place on hold on admission due to left hip hematoma and abscess. Patient underwent I and D hip hematoma on 9/4. Warfarin resumed post-op on 9/4 and heparin resumed back on 9/5 on 0530.   Warfarin home dose: 5 mg PO every evening   Today, 05/21/2018:  - Heparin level therapeutic at 0.34 on heparin drip rate of 1150 unit/hr - INR is subtherapeutic at 1.15 with 5 mg warfarin given on 9/4  - CBC: Hgb 12.1, Plt 339 - RN reported some blood oozing at old IV access site; pressure applied and site seems to be stable  - Drug-drug interactions: antibiotic use can make patient more sensitive to warfarin (vancomycin and cefepime)  - Patient is on thin liquid diet  Goal of Therapy:  Heparin level 0.3-0.7 units/ml; INR 2.5-3.5  (per outpt [facility] clinic note) Monitor platelets by anticoagulation protocol: Yes   Plan:  - Continue heparin drip at rate of 1150 units/hr  - Warfarin 7.5 mg PO for one dose - Monitor for signs and symptoms of bleeding  - Daily INR and CBC    Germain Osgood, Student PharmD 05/21/2018 11:08 AM

## 2018-05-21 NOTE — Care Management Important Message (Signed)
Important Message  Patient Details  Name: Marissa Brown MRN: [id number] Date of Birth: Mar 25, 1941   Medicare Important Message Given:  Yes    Kerin Salen 05/21/2018, 12:24 [place] Message  Patient Details  Name: Marissa Brown MRN: [id number] Date of Birth: 09-Aug-1941   Medicare Important Message Given:  Yes    Kerin Salen 05/21/2018, 12:24 PM

## 2018-05-21 NOTE — Progress Notes (Signed)
   Subjective: 1 Day Post-Op Procedure(s) (LRB): INCISION AND DRAINAGE LEFT HIP HEMATOMA (Left) Patient reports pain as mild.   Patient seen in rounds with Dr. Wynelle Link. Patient is well, and has had no acute complaints or problems  Objective: Vital signs in last 24 hours: Temp:  [97.7 F (36.5 C)-98.4 F (36.9 C)] 98.1 F (36.7 C) (09/05 0418) Pulse Rate:  [39-82] 67 (09/05 0418) Resp:  [16-20] 16 (09/05 0418) BP: (120-189)/(51-116) 138/58 (09/05 0418) SpO2:  [91 %-100 %] 91 % (09/05 0418)  Intake/Output from previous day:  Intake/Output Summary (Last 24 hours) at 05/21/2018 0854 Last data filed at 05/21/2018 0530 Gross per 24 hour  Intake 1502.18 ml  Output 960 ml  Net 542.18 ml     Intake/Output this shift: No intake/output data recorded.  Labs: Recent Labs    05/18/18 1546 05/19/18 0432 05/20/18 0445 05/21/18 0441  HGB 11.5* 10.7* 11.4* 12.1   Recent Labs    05/20/18 0445 05/21/18 0441  WBC 3.8* 4.8  RBC 3.52* 3.74*  HCT 35.6* 37.7  PLT 291 339   Recent Labs    05/19/18 0432 05/21/18 0441  NA 141 137  K 4.0 4.2  CL 110 103  CO2 23 24  BUN 12 9  CREATININE 0.48 0.47  GLUCOSE 97 117*  CALCIUM 8.9 9.5   Recent Labs    05/19/18 1027 05/21/18 0441  INR 1.71 1.15    Exam: General - Patient is Alert and Oriented Extremity - Neurologically intact Neurovascular intact Sensation intact distally Dorsiflexion/Plantar flexion intact Dressing - dressing C/D/I Motor Function - intact, moving foot and toes well on exam.   Past Medical History:  Diagnosis Date  . Cataract    Right Eye  . Chronic back pain   . Cubital tunnel syndrome    left  . Diverticulosis   . History of aortic valve disorder   . History of [facility] spotted fever   . History of subacute bacterial endocarditis   . Hyperlipidemia   . Osteoarthritis of both knees    and hands  . Osteopenia   . Skin cancer    Face    Assessment/Plan: 1 Day Post-Op Procedure(s)  (LRB): INCISION AND DRAINAGE LEFT HIP HEMATOMA (Left) Principal Problem:   Abscess of left hip Active Problems:   OA (osteoarthritis) of knee   H/O heart valve replacement with mechanical valve   History of aortic valve disorder   Chronic back pain   Hyperlipidemia   Abscess of hip, left  Estimated body mass index is 19.85 kg/m as calculated from the following:   Height as of this encounter: 5\' 5"  (1.651 m).   Weight as of this encounter: 54.1 kg. Advance diet Up with therapy  DVT Prophylaxis - Coumadin Weight bearing as tolerated. D/C O2 and pulse ox and try on room air. Hemovac drain came out this AM prior to rounds.   Intraoperative culture did not identify any organisms. Disposition will be per medicine.    Theresa Duty, PA-C Orthopedic Surgery 05/21/2018, 8:54 AM

## 2018-05-21 NOTE — Progress Notes (Addendum)
RN found Hemovac dislodge on patient wound dressing this morning. Oncall Orthopedics informed and ordered to reinforced wound dressing and discard Hemovac. Will continue to monitor.

## 2018-05-21 NOTE — Progress Notes (Signed)
PROGRESS NOTE  Marissa Brown BZJ:[id number] DOB: August 12, 1941 DOA: 05/18/2018 PCP: Jonathon Resides, MD  HPI/Recap of past 28 hours: 77 year old female with hx of hypertension, hyperlipidemia, aortic valve disorder with mechanical AVR on Coumadin, left knee replacement in 5/29, left hip surgery on 04/06/2018 after a mechanical fall at [facility]. 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 Patient was told by her home health nurse on Friday, 05/15/2018 that her wound looked worse. Patient was seen by Dr. Rodney Cruise was placed on cephalexin.Patient has been taking cephalexin until presentation to ED, however noted fevers as high as 102 F at home. Pt also noted some bleeding from the left hip wound. Patient went to [facility] ED, where she was evaluated and found to have pus draining from the wound.CT of the left hip showed 20 cm abscess per the ED physician report. Pt admitted for further management.  Today, pt reported feeling so much better, denies any fever/chills, abdominal pain, chest pain, SOB.   Assessment/Plan: Principal Problem:   Abscess of left hip Active Problems:   OA (osteoarthritis) of knee   H/O heart valve replacement with mechanical valve   History of aortic valve disorder   Chronic back pain   Hyperlipidemia   Abscess of hip, left  Abscess of left hip with history of left hip surgery/replacement in 0/1751, complicated with large hematoma, eschar and chronic wound Afebrile, with no leukocytosis BC X 2 NGTD CT abdomen pelvis at [facility] hospital with findings of large predominantly subcutaneous 19.7 cm abscess overlying the left gluteal muscle with extension to the skin surface and into the left gluteus maximus muscle. No clear communication with the left hip joint. Orthopedics on board: S/P I&D on 05/20/18 Deep wound culture  pending Continue IV Vanc, Cefepime  HTN Stable Continue lopressor, start IV hydralazine prn  History of aortic valve disorder s/p mechanical valve replacement Re-start coumadin (pharmacy to dose) Continue bridging with heparin  OA (osteoarthritis) of knee Presently stable, no acute issues Continue analgesics as needed  Chronic back pain Continue with analgesics as needed  Hyperlipidemia Continue statins    Code Status: Full  Family Communication: None at bedside  Disposition Plan: To be determined   Consultants:  Orthopedics  Procedures:  I&D on 05/20/18  Antimicrobials:  Cefepime  Vancomycin  DVT prophylaxis: Heparin drip and coumadin   Objective: Vitals:   05/20/18 2057 05/20/18 2232 05/21/18 0418 05/21/18 1310  BP: (!) 169/73 (!) 156/51 (!) 138/58 (!) 148/69  Pulse: 69 (!) 39 67 67  Resp:  20 16 18   Temp:  97.7 F (36.5 C) 98.1 F (36.7 C)   TempSrc:      SpO2: 95% 91% 91% 97%  Weight:      Height:        Intake/Output Summary (Last 24 hours) at 05/21/2018 1506 Last data filed at 05/21/2018 0530 Gross per 24 hour  Intake 1502.18 ml  Output 60 ml  Net 1442.18 ml   Filed Weights   05/18/18 1600 05/18/18 1852 05/19/18 0522  Weight: 54.5 kg 54.7 kg 54.1 kg    Exam:   General: NAD  Cardiovascular: S1, S2 present  Respiratory: CTA B  Abdomen: Soft, nontender, nondistended, bowel sounds present  Musculoskeletal: No pedal edema bilaterally  Skin: Noted dressing c/d/i on the left hip  Psychiatry: Normal mood   Data Reviewed:  CBC: Recent Labs  Lab 05/18/18 1546 05/19/18 0432 05/20/18 0445 05/21/18 0441  WBC 5.2 4.3 3.8* 4.8  NEUTROABS 4.3  --   --  4.2  HGB 11.5* 10.7* 11.4* 12.1  HCT 35.6* 33.3* 35.6* 37.7  MCV 101.4* 101.8* 101.1* 100.8*  PLT 252 263 291 970   Basic Metabolic Panel: Recent Labs  Lab 05/18/18 1546 05/19/18 0432 05/21/18 0441  NA 141 141 137  K 4.1 4.0 4.2  CL 106 110 103  CO2 26 23 24     GLUCOSE 119* 97 117*  BUN 15 12 9   CREATININE 0.49 0.48 0.47  CALCIUM 8.9 8.9 9.5   GFR: Estimated Creatinine Clearance: 52.7 mL/min (by C-G formula based on SCr of 0.47 mg/dL). Liver Function Tests: Recent Labs  Lab 05/18/18 1546  AST 30  ALT 19  ALKPHOS 62  BILITOT 0.5  PROT 6.5  ALBUMIN 3.2*   No results for input(s): LIPASE, AMYLASE in the last 168 hours. No results for input(s): AMMONIA in the last 168 hours. Coagulation Profile: Recent Labs  Lab 05/18/18 1546 05/19/18 1027 05/21/18 0441  INR 2.07 1.71 1.15   Cardiac Enzymes: No results for input(s): CKTOTAL, CKMB, CKMBINDEX, TROPONINI in the last 168 hours. BNP (last 3 results) No results for input(s): PROBNP in the last 8760 hours. HbA1C: No results for input(s): HGBA1C in the last 72 hours. CBG: No results for input(s): GLUCAP in the last 168 hours. Lipid Profile: No results for input(s): CHOL, HDL, LDLCALC, TRIG, CHOLHDL, LDLDIRECT in the last 72 hours. Thyroid Function Tests: No results for input(s): TSH, T4TOTAL, FREET4, T3FREE, THYROIDAB in the last 72 hours. Anemia Panel: No results for input(s): VITAMINB12, FOLATE, FERRITIN, TIBC, IRON, RETICCTPCT in the last 72 hours. Urine analysis:    Component Value Date/Time   COLORURINE STRAW (A) 01/22/2018 0649   APPEARANCEUR CLEAR 01/22/2018 0649   LABSPEC 1.008 01/22/2018 0649   PHURINE 7.0 01/22/2018 0649   GLUCOSEU NEGATIVE 01/22/2018 0649   HGBUR SMALL (A) 01/22/2018 0649   BILIRUBINUR NEGATIVE 01/22/2018 0649   KETONESUR NEGATIVE 01/22/2018 0649   PROTEINUR NEGATIVE 01/22/2018 0649   NITRITE NEGATIVE 01/22/2018 0649   LEUKOCYTESUR TRACE (A) 01/22/2018 0649   Sepsis Labs: @LABRCNTIP (procalcitonin:4,lacticidven:4)  ) Recent Results (from the past 240 hour(s))  Culture, blood (routine x 2)     Status: None (Preliminary result)   Collection Time: 05/18/18  4:17 PM  Result Value Ref Range Status   Specimen Description   Final    BLOOD LEFT  ARM Performed at [facility], [place] [address] [place], [place] [address]    Special Requests   Final    BOTTLES DRAWN AEROBIC AND ANAEROBIC Blood Culture adequate volume Performed at [place] [address] [place], [place] [address]    Culture   Final    NO GROWTH 3 DAYS Performed at [facility], [place] [address] [place], [place] [address]    Report Status PENDING  Incomplete  Culture, blood (routine x 2)     Status: None (Preliminary result)   Collection Time: 05/18/18  4:26 PM  Result Value Ref Range Status   Specimen Description   Final    BLOOD RIGHT ARM Performed at [place] [address], [place], [place] [address]    Special Requests   Final    BOTTLES DRAWN AEROBIC AND ANAEROBIC Blood Culture adequate volume Performed at [place] [address] [place], [place] [address]  Culture   Final    NO GROWTH 3 DAYS Performed at [facility], [place] [address] [place], [place] [address]    Report Status PENDING  Incomplete  Surgical pcr screen     Status: None   Collection Time: 05/20/18  8:39 AM  Result Value Ref Range Status   MRSA, PCR NEGATIVE NEGATIVE Final   Staphylococcus aureus NEGATIVE NEGATIVE Final    Comment: (NOTE) The Xpert SA Assay (FDA approved for NASAL specimens in patients 109 years of age and older), is one component of a comprehensive surveillance program. It is not intended to diagnose infection nor to guide or monitor treatment. Performed at [facility], [place] [address], [place], [place] [address]   Aerobic/Anaerobic Culture (surgical/deep wound)     Status: None (Preliminary result)   Collection Time: 05/20/18  6:40 PM  Result Value Ref Range Status   Specimen Description   Final    SYNOVIAL HIP LEFT Performed at [place] [address] [place], [place] [address]    Special Requests   Final     NONE Performed at [facility], [place] [address], [place], [place] [address]    Gram Stain   Final    RARE WBC PRESENT, PREDOMINANTLY PMN NO ORGANISMS SEEN Gram Stain Report Called to,Read Back By and Verified With: [facility] AT 2013 ON 05/20/18 BY N.THOMPSON Performed at [facility], [place] [address] [place], [place] [address]    Culture   Final    NO GROWTH < 12 HOURS Performed at [place] [address] [place],  [address]    Report Status PENDING  Incomplete      Studies: No results found.  Scheduled Meds: . docusate sodium  100 mg Oral BID  . metoprolol tartrate  12.5 mg Oral BID  . nutrition supplement (JUVEN)  1 packet Oral BID BM  . warfarin  7.5 mg Oral ONCE-1800  . Warfarin - Pharmacist Dosing Inpatient   Does not apply q1800    Continuous Infusions: . sodium chloride 75 mL/hr at 05/20/18 2300  . ceFEPime (MAXIPIME) IV 1 g (05/21/18 1351)  . heparin 1,150 Units/hr (05/21/18 0529)  . vancomycin 750 mg (05/21/18 1228)     LOS: 3 days     Alma Friendly, MD [facility]   If 7PM-7AM, please contact night-coverage [email or web address] Password [email or web address] 05/21/2018, 3:06 PM

## 2018-05-22 LAB — CBC WITH DIFFERENTIAL/PLATELET
BASOS ABS: 0 10*3/uL (ref 0.0–0.1)
Basophils Relative: 1 %
EOS PCT: 2 %
Eosinophils Absolute: 0.1 10*3/uL (ref 0.0–0.7)
HCT: 34.1 % — ABNORMAL LOW (ref 36.0–46.0)
HEMOGLOBIN: 11.2 g/dL — AB (ref 12.0–15.0)
LYMPHS ABS: 1.5 10*3/uL (ref 0.7–4.0)
Lymphocytes Relative: 27 %
MCH: 33 pg (ref 26.0–34.0)
MCHC: 32.8 g/dL (ref 30.0–36.0)
MCV: 100.6 fL — ABNORMAL HIGH (ref 78.0–100.0)
Monocytes Absolute: 0.5 10*3/uL (ref 0.1–1.0)
Monocytes Relative: 9 %
NEUTROS PCT: 61 %
Neutro Abs: 3.6 10*3/uL (ref 1.7–7.7)
PLATELETS: 314 10*3/uL (ref 150–400)
RBC: 3.39 MIL/uL — AB (ref 3.87–5.11)
RDW: 15.7 % — ABNORMAL HIGH (ref 11.5–15.5)
WBC: 5.7 10*3/uL (ref 4.0–10.5)

## 2018-05-22 LAB — PROTIME-INR
INR: 1.31
Prothrombin Time: 16.1 seconds — ABNORMAL HIGH (ref 11.4–15.2)

## 2018-05-22 LAB — HEPARIN LEVEL (UNFRACTIONATED): HEPARIN UNFRACTIONATED: 0.42 [IU]/mL (ref 0.30–0.70)

## 2018-05-22 MED ORDER — DIPHENHYDRAMINE HCL 25 MG PO CAPS
25.0000 mg | ORAL_CAPSULE | Freq: Every evening | ORAL | Status: DC | PRN
  Administered 2018-05-22 – 2018-05-23 (×2): 25 mg via ORAL
  Filled 2018-05-22 (×2): qty 1

## 2018-05-22 MED ORDER — WARFARIN SODIUM 5 MG PO TABS
7.5000 mg | ORAL_TABLET | Freq: Once | ORAL | Status: AC
  Administered 2018-05-22: 7.5 mg via ORAL
  Filled 2018-05-22: qty 1

## 2018-05-22 NOTE — Evaluation (Signed)
Physical Therapy Evaluation Patient Details Name: Marissa Brown MRN: [id number] DOB: November 16, 1940 Today's Date: 05/22/2018   History of Present Illness  Patient is a 77 yr old female admitted 05/18/2018 with abscess of left hip; I 7 D L hip wound 05/20/18; PMH: s/p L THA 04/06/18 hip fracture; s/p L TKA 02/11/18, heart valve replacement, chronic LBP, Tspine compression fracture, HLD.    Clinical Impression  Patient performed well ambulating with RW 450 feet with supervision assistance; min assist for IV pole only.  Patient reports her hip no longer "gives" when weight bearing since the I & D surgery and muscle reattachment. Patient would continue to benefit from skilled physical therapy in current environment and next venue to continue return to prior function and increase strength, endurance, balance, coordination, and functional mobility and gait skills. Patient would also benefit from ambulation with RW with nursing when able.      Follow Up Recommendations Home health PT;Supervision for mobility/OOB    Equipment Recommendations  None recommended by PT    Recommendations for Other Services       Precautions / Restrictions Precautions Precautions: Fall Restrictions Weight Bearing Restrictions: No      Mobility  Bed Mobility Overal bed mobility: Modified Independent                Transfers Overall transfer level: Needs assistance Equipment used: Rolling walker (2 wheeled) Transfers: Sit to/from [employer] Transfers Sit to Stand: Supervision;Min assist Stand pivot transfers: Supervision;Min assist       General transfer comment: min assist for line management  Ambulation/Gait Ambulation/Gait assistance: Supervision;Min assist Gait Distance (Feet): 450 Feet Assistive device: Rolling walker (2 wheeled) Gait Pattern/deviations: Step-through pattern;Decreased stance time - left;Decreased stride length;Decreased step length - right;Decreased weight shift to  left;Antalgic Gait velocity: decreased   General Gait Details: min assist for IV pole  Stairs            Wheelchair Mobility    Modified Rankin (Stroke Patients Only)       Balance Overall balance assessment: Needs assistance Sitting-balance support: Bilateral upper extremity supported;Feet supported Sitting balance-Marissa Brown: Good     Standing balance support: Bilateral upper extremity supported;During functional activity Standing balance-Marissa Brown: Good Standing balance comment: fair without AD                             Pertinent Vitals/Pain Pain Assessment: 0-10 Pain Score: 0-No pain    Home Living Family/patient expects to be discharged to:: Private residence Living Arrangements: Spouse/significant other Available Help at Discharge: Family Type of Home: House Home Access: Stairs to enter Entrance Stairs-Rails: Right Entrance Stairs-Number of Steps: 4 Home Layout: One level Home Equipment: Environmental consultant - 2 wheels;Bedside commode;Cane - single point      Prior Function Level of Independence: Independent with assistive device(s);Needs assistance   Gait / Transfers Assistance Needed: walking with RW some; some with SPC; HHPT  ADL's / Homemaking Assistance Needed: bathing, dressing independent; some assistance for laundry, cooking        Hand Dominance   Dominant Hand: Right    Extremity/Trunk Assessment   Upper Extremity Assessment Upper Extremity Assessment: Overall WFL for tasks assessed    Lower Extremity Assessment Lower Extremity Assessment: Overall WFL for tasks assessed       Communication   Communication: No difficulties  Cognition Arousal/Alertness: Awake/alert Behavior During Therapy: WFL for tasks assessed/performed Overall Cognitive Status: Within Functional Limits for tasks assessed  General Comments      Exercises     Assessment/Plan    PT Assessment  Patient needs continued PT services  PT Problem List Decreased strength;Decreased mobility;Decreased activity tolerance;Decreased balance       PT Treatment Interventions DME instruction;Therapeutic activities;Gait training;Therapeutic exercise;Patient/family education;Stair training;Balance training    PT Goals (Current goals can be found in the Care Plan section)  Acute Rehab PT Goals Patient Stated Goal: go home with HHPT PT Goal Formulation: With patient Time For Goal Achievement: 05/29/18 Potential to Achieve Goals: Good    Frequency Min 3X/week   Barriers to discharge        Co-evaluation               AM-PAC PT "6 Clicks" Daily Activity  Outcome Measure Difficulty turning over in bed (including adjusting bedclothes, sheets and blankets)?: A Little Difficulty moving from lying on back to sitting on the side of the bed? : A Little Difficulty sitting down on and standing up from a chair with arms (e.g., wheelchair, bedside commode, etc,.)?: A Little Help needed moving to and from a bed to chair (including a wheelchair)?: A Little Help needed walking in hospital room?: A Little Help needed climbing 3-5 steps with a railing? : A Little 6 Click Score: 18    End of Session Equipment Utilized During Treatment: Gait belt Activity Tolerance: Patient tolerated treatment well Patient left: in bed;with call bell/phone within reach;with nursing/sitter in room Nurse Communication: Mobility status PT Visit Diagnosis: Other abnormalities of gait and mobility (R26.89);Difficulty in walking, not elsewhere classified (R26.2);Unsteadiness on feet (R26.81);History of falling (Z91.81)    Time: 1140-1210 PT Time Calculation (min) (ACUTE ONLY): 30 min   Charges:   PT Evaluation $PT Eval Moderate Complexity: 1 Mod PT Treatments $Gait Training: 8-22 mins        Floria Raveling. Hartnett-Rands, MS, PT Per [place] #[id number] 05/22/2018, 12:22 PM

## 2018-05-22 NOTE — Discharge Instructions (Signed)
Dr. Gaynelle Arabian Total Joint Specialist [facility] [address] [place], [place] [address] ([phone]) [place] items at home which could result in a fall. This includes throw rugs or furniture in walking pathways.   ICE to the affected hip every three hours for 30 minutes at a time and then as needed for pain and swelling.  Continue to use ice on the hip for pain and swelling from surgery. You may notice swelling that will progress down to the foot and ankle.  This is normal after surgery.  Elevate the leg when you are not up walking on it.    Continue to use the breathing machine which will help keep your temperature down.  It is common for your temperature to cycle up and down following surgery, especially at night when you are not up moving around and exerting yourself.  The breathing machine keeps your lungs expanded and your temperature down.  DIET You may resume your previous home diet once your are discharged from the hospital.  DRESSING / WOUND CARE / SHOWERING You may shower 3 days after surgery, but keep the wounds dry during showering.  You may use an occlusive plastic wrap (Press'n Seal for example), NO SOAKING/SUBMERGING IN THE BATHTUB.  If the bandage gets wet, change with a clean dry gauze.  If the incision gets wet, pat the wound dry with a clean towel. You may start showering once you are discharged home but do not submerge the incision under water. Just pat the incision dry and apply a dry gauze dressing on daily. Change the surgical dressing daily and reapply a dry dressing each time.  ACTIVITY Walk with your walker as instructed. Use walker as long as suggested by your caregivers. Avoid periods of inactivity such as sitting longer than an hour when not asleep. This helps prevent blood clots.  You may resume a sexual relationship in one month or when given the OK by your doctor.  You may return to work once you are cleared  by your doctor.  Do not drive a car for 6 weeks or until released by you surgeon.  Do not drive while taking narcotics.  WEIGHT BEARING Weight bearing as tolerated with assist device (walker, cane, etc) as directed, use it as long as suggested by your surgeon or therapist, typically at least 4-6 weeks.  POSTOPERATIVE CONSTIPATION PROTOCOL Constipation - defined medically as fewer than three stools per week and severe constipation as less than one stool per week.  One of the most common issues patients have following surgery is constipation.  Even if you have a regular bowel pattern at home, your normal regimen is likely to be disrupted due to multiple reasons following surgery.  Combination of anesthesia, postoperative narcotics, change in appetite and fluid intake all can affect your bowels.  In order to avoid complications following surgery, here are some recommendations in order to help you during your recovery period.  Colace (docusate) - Pick up an over-the-counter form of Colace or another stool softener and take twice a day as long as you are requiring postoperative pain medications.  Take with a full glass of water daily.  If you experience loose stools or diarrhea, hold the colace until you stool forms back up.  If your symptoms do not get better within 1 week or if they get worse, check with your doctor.  Dulcolax (bisacodyl) - Pick up over-the-counter and take as directed by the  product packaging as needed to assist with the movement of your bowels.  Take with a full glass of water.  Use this product as needed if not relieved by Colace only.   MiraLax (polyethylene glycol) - Pick up over-the-counter to have on hand.  MiraLax is a solution that will increase the amount of water in your bowels to assist with bowel movements.  Take as directed and can mix with a glass of water, juice, soda, coffee, or tea.  Take if you go more than two days without a movement. Do not use MiraLax more than  once per day. Call your doctor if you are still constipated or irregular after using this medication for 7 days in a row.  If you continue to have problems with postoperative constipation, please contact the office for further assistance and recommendations.  If you experience "the worst abdominal pain ever" or develop nausea or vomiting, please contact the office immediatly for further recommendations for treatment.  ITCHING  If you experience itching with your medications, try taking only a single pain pill, or even half a pain pill at a time.  You can also use Benadryl over the counter for itching or also to help with sleep.   MEDICATIONS See your medication summary on the After Visit Summary that the nursing staff will review with you prior to discharge.  You may have some home medications which will be placed on hold until you complete the course of blood thinner medication.  It is important for you to complete the blood thinner medication as prescribed by your surgeon.  Continue your approved medications as instructed at time of discharge.  PRECAUTIONS If you experience chest pain or shortness of breath - call 911 immediately for transfer to the hospital emergency department.  If you develop a fever greater that 101 F, purulent drainage from wound, increased redness or drainage from wound, foul odor from the wound/dressing, or calf pain - CONTACT YOUR SURGEON.                                                   FOLLOW-UP APPOINTMENTS Make sure you keep all of your appointments after your operation with your surgeon and caregivers. You should call the office at the above phone number and make an appointment for approximately two weeks after the date of your surgery or on the date instructed by your surgeon outlined in the "After Visit Summary".   IF YOU ARE TRANSFERRED TO A SKILLED REHAB FACILITY If the patient is transferred to a skilled rehab facility following release from the hospital, a  list of the current medications will be sent to the facility for the patient to continue.  When discharged from the skilled rehab facility, please have the facility set up the patient's [place] prior to being released. Also, the skilled facility will be responsible for providing the patient with their medications at time of release from the facility to include their pain medication, the muscle relaxants, and their blood thinner medication. If the patient is still at the rehab facility at time of the two week follow up appointment, the skilled rehab facility will also need to assist the patient in arranging follow up appointment in our office and any transportation needs.  MAKE SURE YOU:   Understand these instructions.   Get help right  away if you are not doing well or get worse.    Pick up stool softner and laxative for home use following surgery while on pain medications. Do not submerge incision under water. Please use good hand washing techniques while changing dressing each day. May shower starting three days after surgery. Please use a clean towel to pat the incision dry following showers. Continue to use ice for pain and swelling after surgery. Do not use any lotions or creams on the incision until instructed by your surgeon.

## 2018-05-22 NOTE — Progress Notes (Signed)
PROGRESS NOTE  Marissa Brown KGU:[id number] DOB: 05-05-1941 DOA: 05/18/2018 PCP: Jonathon Resides, MD  HPI/Recap of past 21 hours: 77 year old female with hx of hypertension, hyperlipidemia, aortic valve disorder with mechanical AVR on Coumadin, left knee replacement in 5/29, left hip surgery on 04/06/2018 after a mechanical fall at [facility]. 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 Patient was told by her home health nurse on Friday, 05/15/2018 that her wound looked worse. Patient was seen by Dr. Rodney Cruise was placed on cephalexin.Patient has been taking cephalexin until presentation to ED, however noted fevers as high as 102 F at home. Pt also noted some bleeding from the left hip wound. Patient went to [facility] ED, where she was evaluated and found to have pus draining from the wound.CT of the left hip showed 20 cm abscess per the ED physician report. Pt admitted for further management.  Today, pt reported feeling so much better, no new complaints.   Assessment/Plan: Principal Problem:   Abscess of left hip Active Problems:   OA (osteoarthritis) of knee   H/O heart valve replacement with mechanical valve   History of aortic valve disorder   Chronic back pain   Hyperlipidemia   Abscess of hip, left  Abscess of left hip with history of left hip surgery/replacement in 02/2830, complicated with large hematoma, eschar and chronic wound Afebrile, with no leukocytosis BC X 2 NGTD CT abdomen pelvis at [facility] hospital with findings of large predominantly subcutaneous 19.7 cm abscess overlying the left gluteal muscle with extension to the skin surface and into the left gluteus maximus muscle. No clear communication with the left hip joint. Orthopedics on board: S/P I&D on 05/20/18 Deep wound culture pending D/C IV Vanc, continue IV Cefepime  for a total of 7 days  HTN Stable Continue lopressor, IV hydralazine prn  History of aortic valve disorder s/p mechanical valve replacement Re-start coumadin (pharmacy to dose) Continue bridging with heparin  OA (osteoarthritis) of knee Presently stable, no acute issues Continue analgesics as needed  Chronic back pain Continue with analgesics as needed  Hyperlipidemia Continue statins    Code Status: Full  Family Communication: None at bedside  Disposition Plan: Home once INR is ~2   Consultants:  Orthopedics  Procedures:  I&D on 05/20/18  Antimicrobials:  Cefepime  DVT prophylaxis: Heparin drip and coumadin   Objective: Vitals:   05/21/18 2106 05/21/18 2108 05/22/18 0527 05/22/18 1355  BP: (!) 137/50 (!) 137/50 (!) 161/72 (!) 159/61  Pulse: 69 69 63 61  Resp: 16  20 20   Temp: 98.8 F (37.1 C)  98 F (36.7 C) 98.2 F (36.8 C)  TempSrc: Oral  Oral Oral  SpO2: 98%  98% 94%  Weight:      Height:        Intake/Output Summary (Last 24 hours) at 05/22/2018 1539 Last data filed at 05/22/2018 1000 Gross per 24 hour  Intake 392.22 ml  Output -  Net 392.22 ml   Filed Weights   05/18/18 1600 05/18/18 1852 05/19/18 0522  Weight: 54.5 kg 54.7 kg 54.1 kg    Exam:   General: NAD  Cardiovascular: S1, S2 present  Respiratory: CTA B  Abdomen: Soft, nontender, nondistended, bowel sounds present  Musculoskeletal: No pedal edema bilaterally  Skin: Noted dressing c/d/i on the left hip  Psychiatry: Normal mood  Data Reviewed: CBC: Recent Labs  Lab 05/18/18 1546 05/19/18 0432 05/20/18 0445 05/21/18 0441 05/22/18 0421  WBC 5.2 4.3 3.8* 4.8 5.7  NEUTROABS 4.3  --   --  4.2 3.6  HGB 11.5* 10.7* 11.4* 12.1 11.2*  HCT 35.6* 33.3* 35.6* 37.7 34.1*  MCV 101.4* 101.8* 101.1* 100.8* 100.6*  PLT 252 263 291 339 595   Basic Metabolic Panel: Recent Labs  Lab 05/18/18 1546 05/19/18 0432 05/21/18 0441  NA 141 141 137  K 4.1 4.0 4.2  CL 106  110 103  CO2 26 23 24   GLUCOSE 119* 97 117*  BUN 15 12 9   CREATININE 0.49 0.48 0.47  CALCIUM 8.9 8.9 9.5   GFR: Estimated Creatinine Clearance: 52.7 mL/min (by C-G formula based on SCr of 0.47 mg/dL). Liver Function Tests: Recent Labs  Lab 05/18/18 1546  AST 30  ALT 19  ALKPHOS 62  BILITOT 0.5  PROT 6.5  ALBUMIN 3.2*   No results for input(s): LIPASE, AMYLASE in the last 168 hours. No results for input(s): AMMONIA in the last 168 hours. Coagulation Profile: Recent Labs  Lab 05/18/18 1546 05/19/18 1027 05/21/18 0441 05/22/18 0421  INR 2.07 1.71 1.15 1.31   Cardiac Enzymes: No results for input(s): CKTOTAL, CKMB, CKMBINDEX, TROPONINI in the last 168 hours. BNP (last 3 results) No results for input(s): PROBNP in the last 8760 hours. HbA1C: No results for input(s): HGBA1C in the last 72 hours. CBG: No results for input(s): GLUCAP in the last 168 hours. Lipid Profile: No results for input(s): CHOL, HDL, LDLCALC, TRIG, CHOLHDL, LDLDIRECT in the last 72 hours. Thyroid Function Tests: No results for input(s): TSH, T4TOTAL, FREET4, T3FREE, THYROIDAB in the last 72 hours. Anemia Panel: No results for input(s): VITAMINB12, FOLATE, FERRITIN, TIBC, IRON, RETICCTPCT in the last 72 hours. Urine analysis:    Component Value Date/Time   COLORURINE STRAW (A) 01/22/2018 0649   APPEARANCEUR CLEAR 01/22/2018 0649   LABSPEC 1.008 01/22/2018 0649   PHURINE 7.0 01/22/2018 0649   GLUCOSEU NEGATIVE 01/22/2018 0649   HGBUR SMALL (A) 01/22/2018 0649   BILIRUBINUR NEGATIVE 01/22/2018 0649   KETONESUR NEGATIVE 01/22/2018 0649   PROTEINUR NEGATIVE 01/22/2018 0649   NITRITE NEGATIVE 01/22/2018 0649   LEUKOCYTESUR TRACE (A) 01/22/2018 0649   Sepsis Labs: @LABRCNTIP (procalcitonin:4,lacticidven:4)  ) Recent Results (from the past 240 hour(s))  Culture, blood (routine x 2)     Status: None (Preliminary result)   Collection Time: 05/18/18  4:17 PM  Result Value Ref Range Status    Specimen Description   Final    BLOOD LEFT ARM Performed at [facility], [place] [address] [place], [place] [address]    Special Requests   Final    BOTTLES DRAWN AEROBIC AND ANAEROBIC Blood Culture adequate volume Performed at [place] [address], [place], [place] [address]    Culture   Final    NO GROWTH 4 DAYS Performed at [facility], [place] [address] [place], [place] [address]    Report Status PENDING  Incomplete  Culture, blood (routine x 2)     Status: None (Preliminary result)   Collection Time: 05/18/18  4:26 PM  Result Value Ref Range Status   Specimen Description   Final    BLOOD RIGHT ARM Performed at [facility] [place] [address], [place], [place] [address]    Special Requests   Final    BOTTLES DRAWN AEROBIC AND ANAEROBIC Blood Culture adequate volume Performed at  [facility], [place] [address], [place], [place] [address]    Culture   Final    NO GROWTH 4 DAYS Performed at [facility], [place] [address] [place], [place] [address]    Report Status PENDING  Incomplete  Surgical pcr screen     Status: None   Collection Time: 05/20/18  8:39 AM  Result Value Ref Range Status   MRSA, PCR NEGATIVE NEGATIVE Final   Staphylococcus aureus NEGATIVE NEGATIVE Final    Comment: (NOTE) The Xpert SA Assay (FDA approved for NASAL specimens in patients 13 years of age and older), is one component of a comprehensive surveillance program. It is not intended to diagnose infection nor to guide or monitor treatment. Performed at [facility], [place] [address], [place], [place] [address]   Aerobic/Anaerobic Culture (surgical/deep wound)     Status: None (Preliminary result)   Collection Time: 05/20/18  6:40 PM  Result Value Ref Range Status   Specimen Description   Final    SYNOVIAL HIP LEFT Performed at [place] [address], [place], [place]  [address]    Special Requests   Final    NONE Performed at [facility], [place] [address] [place], [place] [address]    Gram Stain   Final    RARE WBC PRESENT, PREDOMINANTLY PMN NO ORGANISMS SEEN Gram Stain Report Called to,Read Back By and Verified With: [facility] AT 2013 ON 05/20/18 BY N.THOMPSON Performed at [facility], [place] [address], [place], [place] [address]    Culture   Final    FEW ENTEROCOCCUS FAECALIS NO ANAEROBES ISOLATED; CULTURE IN PROGRESS FOR 5 DAYS    Report Status PENDING  Incomplete      Studies: No results found.  Scheduled Meds: . docusate sodium  100 mg Oral BID  . metoprolol tartrate  12.5 mg Oral BID  . nutrition supplement (JUVEN)  1 packet Oral BID BM  . warfarin  7.5 mg Oral ONCE-1800  . Warfarin - Pharmacist Dosing Inpatient   Does not apply q1800    Continuous Infusions: . ceFEPime (MAXIPIME) IV 1 g (05/22/18 1352)  . heparin 1,150 Units/hr (05/22/18 0309)     LOS: 4 days     Alma Friendly, MD [facility]   If 7PM-7AM, please contact night-coverage [email or web address] Password [facility] 05/22/2018, 3:39 PM

## 2018-05-22 NOTE — Progress Notes (Addendum)
   Subjective: 2 Days Post-Op Procedure(s) (LRB): INCISION AND DRAINAGE LEFT HIP HEMATOMA (Left) Patient reports pain as mild.   Patient seen in rounds with Dr. Wynelle Link. Patient is well, and has had no acute complaints or problems. Reports only minimal discomfort in the left hip.   Objective: Vital signs in last 24 hours: Temp:  [97.4 F (36.3 C)-98.8 F (37.1 C)] 98 F (36.7 C) (09/06 0527) Pulse Rate:  [63-69] 63 (09/06 0527) Resp:  [16-20] 20 (09/06 0527) BP: (137-174)/(50-72) 161/72 (09/06 0527) SpO2:  [97 %-98 %] 98 % (09/06 0527)  Intake/Output from previous day:  Intake/Output Summary (Last 24 hours) at 05/22/2018 0804 Last data filed at 05/22/2018 0600 Gross per 24 hour  Intake 1590.28 ml  Output -  Net 1590.28 ml    Labs: Recent Labs    05/20/18 0445 05/21/18 0441 05/22/18 0421  HGB 11.4* 12.1 11.2*   Recent Labs    05/21/18 0441 05/22/18 0421  WBC 4.8 5.7  RBC 3.74* 3.39*  HCT 37.7 34.1*  PLT 339 314   Recent Labs    05/21/18 0441  NA 137  K 4.2  CL 103  CO2 24  BUN 9  CREATININE 0.47  GLUCOSE 117*  CALCIUM 9.5   Recent Labs    05/21/18 0441 05/22/18 0421  INR 1.15 1.31    Exam: General - Patient is Alert and Oriented Extremity - Neurologically intact Neurovascular intact Sensation intact distally Dorsiflexion/Plantar flexion intact Dressing/Incision - clean, dry, no drainage. 2-3 cm eschar present inferior to incision. Dressing changed. Motor Function - intact, moving foot and toes well on exam.   Past Medical History:  Diagnosis Date  . Cataract    Right Eye  . Chronic back pain   . Cubital tunnel syndrome    left  . Diverticulosis   . History of aortic valve disorder   . History of [facility] spotted fever   . History of subacute bacterial endocarditis   . Hyperlipidemia   . Osteoarthritis of both knees    and hands  . Osteopenia   . Skin cancer    Face    Assessment/Plan: 2 Days Post-Op Procedure(s)  (LRB): INCISION AND DRAINAGE LEFT HIP HEMATOMA (Left) Principal Problem:   Abscess of left hip Active Problems:   OA (osteoarthritis) of knee   H/O heart valve replacement with mechanical valve   History of aortic valve disorder   Chronic back pain   Hyperlipidemia   Abscess of hip, left  Estimated body mass index is 19.85 kg/m as calculated from the following:   Height as of this encounter: 5\' 5"  (1.651 m).   Weight as of this encounter: 54.1 kg.  DVT Prophylaxis - Coumadin Weight-bearing as tolerated  Intraoperative cultures show no growth. Pharmacy to dose coumadin. She will not require oral antibiotics upon discharge, as there are no indications of infection. Disposition will be per medicine.   Theresa Duty, PA-C Orthopedic Surgery 05/22/2018, 8:04 AM

## 2018-05-22 NOTE — Progress Notes (Signed)
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 Jude AVR, CVA   No Known Allergies  Patient Measurements:  Height: 5\' 5"  (165.1 cm) Weight: 119 lb 4.3 oz (54.1 kg) IBW/kg (Calculated) : 57 Heparin Dosing Weight: 54 kg  Vital Signs: Temp: 98 F (36.7 C) (09/06 0527) Temp Source: Oral (09/06 0527) BP: 161/72 (09/06 0527) Pulse Rate: 63 (09/06 0527)  Labs: Recent Labs    05/19/18 1027  05/20/18 0445 05/21/18 0441 05/21/18 1317 05/22/18 0421  HGB  --    < > 11.4* 12.1  --  11.2*  HCT  --   --  35.6* 37.7  --  34.1*  PLT  --   --  291 339  --  314  LABPROT 19.9*  --   --  14.6  --  16.1*  INR 1.71  --   --  1.15  --  1.31  HEPARINUNFRC  --    < > 0.44  --  0.34 0.42  CREATININE  --   --   --  0.47  --   --    < > = values in this interval not displayed.    Estimated Creatinine Clearance: 52.7 mL/min (by C-G formula based on SCr of 0.47 mg/dL).  Assessment: Patient's a 77 y.o F with hx St. Jude AVR and CVA on warfarin PTA and left hip surgery/replacement in July 2019, presented to [facility] from [place] on 9/2 for management of hip abscess. Warfarin place on hold on admission due to left hip hematoma and abscess. Patient underwent I and D hip hematoma on 9/4. Warfarin resumed post-op on 9/4 and heparin resumed back on 9/5 on 0530.   Warfarin home dose: 5 mg PO every evening  Today, 05/22/2018: - Heparin level remains therapeutic at 0.42 on heparin drip rate of 1150 unit/hr - INR is sub-therapeutic at 1.31. but is trending up - CBC stable - Drug-drug interactions: antibiotic use can make patient more sensitive to warfarin (vancomycin and cefepime)  - on regular diet  Goal of Therapy:  Heparin level 0.3-0.7 units/ml; INR 2.5-3.5 (per outpt [facility] clinic note) Monitor platelets by anticoagulation protocol: Yes   Plan:  - Continue heparin drip at rate of 1150 units/hr  - Repeat Warfarin 7.5 mg PO x1 today (1.5x home dose) - Monitor  for signs and symptoms of bleeding  - Daily INR and CBC    Dia Sitter, PharmD, BCPS 05/22/2018 8:23 AM

## 2018-05-22 NOTE — Progress Notes (Signed)
Pharmacy Antibiotic Note  Marissa Brown is a 77 y.o. female with hx St. 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 Her wound appeared to be getting worse and she was prescribed keflex by Dr. Maureen Ralphs on 8/30 (she took keflex starting on 8/31-9/2 AM) . Patient presented to [facility] ED on 9/2 for workup of wound.  CT at [facility] showed 20 cm abscess and she was transferred to [facility] on 9/2 for further workup and management.  Vancomycin and zosyn started on admission for abscess. Zosyn changed to cefepime on 9/3. Patient underwent left hip hematoma I&D on 9/4.  Today, 05/22/2018: - day #5 abx - all cultures have been negative thus far - afeb, wbc wnl - scr stable - I spoke to [employer] (ortho PA) on 9/6 re: abx.  She reported that no infection noted when they did I&D for the hematoma and suggested that abx is likely not needed for pt.  Informed Dr. Horris Latino.  Dr. Horris Latino said ok to d/c vancomycin, but would like to continue with cefepime for now (likely will treat for 7 days)  Plan: - continue cefepime 1gm IV q8h - d/c vancomycin _______________________________  Temp (24hrs), Avg:97.9 F (36.6 C), Min:97.4 F (36.3 C), Max:98.8 F (37.1 C)  Recent Labs  Lab 05/18/18 1546 05/19/18 0432 05/20/18 0445 05/21/18 0441 05/22/18 0421  WBC 5.2 4.3 3.8* 4.8 5.7  CREATININE 0.49 0.48  --  0.47  --     Estimated Creatinine Clearance: 52.7 mL/min (by C-G formula based on SCr of 0.47 mg/dL).    No Known Allergies  Antimicrobials this admission:  9/2 zosyn>>9/3 9/3 cefepime>> 9/2 vanc>> 9/6  Dose adjustments this admission:  --  Microbiology results:  9/2 BCx2:  9/4 MRSA PCR: neg 9/4 synovial left hip:   Thank you for allowing pharmacy to be a part of this patient's care.  Lynelle Doctor 05/22/2018 10:35 AM

## 2018-05-23 DIAGNOSIS — L02416 Cutaneous abscess of left lower limb: Secondary | ICD-10-CM

## 2018-05-23 LAB — CBC WITH DIFFERENTIAL/PLATELET
BASOS PCT: 0 %
Basophils Absolute: 0 10*3/uL (ref 0.0–0.1)
EOS ABS: 0.1 10*3/uL (ref 0.0–0.7)
Eosinophils Relative: 1 %
HEMATOCRIT: 36.5 % (ref 36.0–46.0)
HEMOGLOBIN: 11.7 g/dL — AB (ref 12.0–15.0)
LYMPHS ABS: 1.2 10*3/uL (ref 0.7–4.0)
Lymphocytes Relative: 14 %
MCH: 32.3 pg (ref 26.0–34.0)
MCHC: 32.1 g/dL (ref 30.0–36.0)
MCV: 100.8 fL — ABNORMAL HIGH (ref 78.0–100.0)
MONO ABS: 0.8 10*3/uL (ref 0.1–1.0)
MONOS PCT: 9 %
NEUTROS ABS: 6.5 10*3/uL (ref 1.7–7.7)
Neutrophils Relative %: 76 %
Platelets: 345 10*3/uL (ref 150–400)
RBC: 3.62 MIL/uL — ABNORMAL LOW (ref 3.87–5.11)
RDW: 15.8 % — ABNORMAL HIGH (ref 11.5–15.5)
WBC: 8.6 10*3/uL (ref 4.0–10.5)

## 2018-05-23 LAB — CULTURE, BLOOD (ROUTINE X 2)
CULTURE: NO GROWTH
Culture: NO GROWTH
Special Requests: ADEQUATE
Special Requests: ADEQUATE

## 2018-05-23 LAB — HEPARIN LEVEL (UNFRACTIONATED): Heparin Unfractionated: 0.45 IU/mL (ref 0.30–0.70)

## 2018-05-23 LAB — PROTIME-INR
INR: 1.55
PROTHROMBIN TIME: 18.4 s — AB (ref 11.4–15.2)

## 2018-05-23 MED ORDER — AMOXICILLIN 250 MG PO CAPS
500.0000 mg | ORAL_CAPSULE | Freq: Three times a day (TID) | ORAL | Status: DC
  Administered 2018-05-23 – 2018-05-24 (×4): 500 mg via ORAL
  Filled 2018-05-23 (×4): qty 2

## 2018-05-23 MED ORDER — WARFARIN SODIUM 5 MG PO TABS
10.0000 mg | ORAL_TABLET | ORAL | Status: AC
  Administered 2018-05-23: 10 mg via ORAL
  Filled 2018-05-23: qty 2

## 2018-05-23 MED ORDER — WARFARIN SODIUM 5 MG PO TABS: 7.5000 mg | ORAL_TABLET | Freq: Once | ORAL | Status: DC

## 2018-05-23 NOTE — Progress Notes (Addendum)
[place] for heparin and warfarin Indication: hx St. Jude AVR, CVA   No Known Allergies  Patient Measurements:  Height: 5\' 5"  (165.1 cm) Weight: 119 lb 4.3 oz (54.1 kg) IBW/kg (Calculated) : 57 Heparin Dosing Weight: 54 kg  Vital Signs: Temp: 98 F (36.7 C) (09/07 0543) Temp Source: Oral (09/07 0543) BP: 159/55 (09/07 0543) Pulse Rate: 61 (09/07 0543)  Labs: Recent Labs    05/21/18 0441 05/21/18 1317 05/22/18 0421 05/23/18 0524  HGB 12.1  --  11.2* 11.7*  HCT 37.7  --  34.1* 36.5  PLT 339  --  314 345  LABPROT 14.6  --  16.1* 18.4*  INR 1.15  --  1.31 1.55  HEPARINUNFRC  --  0.34 0.42 0.45  CREATININE 0.47  --   --   --     Estimated Creatinine Clearance: 52.7 mL/min (by C-G formula based on SCr of 0.47 mg/dL).  Assessment: Patient's a 77 y.o F with hx St. Jude AVR and CVA on warfarin PTA and left hip surgery/replacement in July 2019, presented to [facility] from [place] on 9/2 for management of hip abscess. Warfarin place on hold on admission due to left hip hematoma and abscess. Patient underwent I and D hip hematoma on 9/4. Warfarin resumed post-op on 9/4 and heparin resumed back on 9/5 on 0530.   Warfarin home dose: 5 mg PO every evening  Today, 05/23/2018: - Heparin level remains therapeutic at 0.45 on heparin drip rate of 1150 unit/hr - INR is sub-therapeutic at 155 but is trending up - CBC stable - Drug-drug interactions: antibiotic use can make patient more sensitive to warfarin (cefepime)  - on regular diet  Goal of Therapy:  Heparin level 0.3-0.7 units/ml;  INR 2.5-3.5 (per outpt [facility] clinic note) Monitor platelets by anticoagulation protocol: Yes   Plan:  - Continue heparin drip at rate of 1150 units/hr  - Repeat Warfarin 7.5 mg PO x1 today (1.5x home dose)- will give dose early - Monitor for signs and symptoms of bleeding  - Daily INR and CBC    Eudelia Bunch, Pharm.D [phone] 05/23/2018 8:35  AM  Addendum:  MD requested coumadin dose be increased to 10 mg as pt adamant about leaving tomorrow.  Eudelia Bunch, Pharm.D [phone] 05/23/2018 9:00 AM

## 2018-05-23 NOTE — Progress Notes (Signed)
PROGRESS NOTE    Marissa Brown  DDU:[id number] DOB: 21-Aug-1941 DOA: 05/18/2018 PCP: Jonathon Resides, MD  Brief Narrative:77 year old female with hx of hypertension, hyperlipidemia, aortic valve disorder with mechanical AVR on Coumadin, left knee replacement in 5/29, left hip surgery on 04/06/2018 after a mechanical fall at [facility]. 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 Patient was told by her home health nurse on Friday, 05/15/2018 that her wound looked worse. Patient was seen by Dr. Rodney Cruise was placed on cephalexin.Patient has been taking cephalexin until presentation to ED, however noted fevers as high as 102 F at home. Pt also noted some bleeding from the left hip wound. Patient went to [facility] ED, where she was evaluated and found to have pus draining from the wound.CT of the left hip showed 20 cm abscess per the ED physician report. Pt admitted for further management. Assessment & Plan:   Principal Problem:   Abscess of left hip Active Problems:   OA (osteoarthritis) of knee   H/O heart valve replacement with mechanical valve   History of aortic valve disorder   Chronic back pain   Hyperlipidemia   Abscess of hip, left  Abscess of left hip with history of left hip surgery/replacement in 10/3760, complicated with large hematoma, eschar and chronic wound Afebrile, with no leukocytosis BC X 2 NGTD CT [facility] predominantly subcutaneous 19.7 cm abscess overlying the left gluteal muscle with extension to the skin surface and into the left gluteus maximus muscle. No clear communication with the left hip joint. Orthopedics on board: S/P I&D on 05/20/18 Deep wound culture Enterococcus faecalis sensitive to ampicillin will DC cefepime and start ampicillin. D/C IV Vanc, dc  Cefepime  HTN Stable Continue lopressor, IV hydralazine prn  History of aortic valve disorder s/p mechanical valve replacement Re-start coumadin (pharmacy to dose) Continue bridging with heparin  OA (osteoarthritis) of knee Presentlystable, no acute issues Continue analgesics as needed  Chronic back pain Continue with analgesics as needed  Hyperlipidemia Continue statins   DVT prophylaxis: Heparin and Coumadin Code Status:full Code Family Communication: None at bedside Disposition Plan plan discharge tomorrow if INR is above 2  Consultants: Ortho  Procedures I&D 05/20/2018 Antimicrobials cefepime DC'd 05/23/2018 starting Unasyn 05/23/2018.  Subjective: Sitting up in bed in no acute distress anxious to go home denies any chest pain shortness of breath diarrhea nausea vomiting.  Objective: Vitals:   05/22/18 0527 05/22/18 1355 05/22/18 2103 05/23/18 0543  BP: (!) 161/72 (!) 159/61 (!) 159/65 (!) 159/55  Pulse: 63 61 66 61  Resp: 20 20 16 20   Temp: 98 F (36.7 C) 98.2 F (36.8 C) 98 F (36.7 C) 98 F (36.7 C)  TempSrc: Oral Oral Oral Oral  SpO2: 98% 94% 97% 96%  Weight:      Height:        Intake/Output Summary (Last 24 hours) at 05/23/2018 1231 Last data filed at 05/23/2018 0600 Gross per 24 hour  Intake 469.96 ml  Output -  Net 469.96 ml   Filed Weights   05/18/18 1600 05/18/18 1852 05/19/18 0522  Weight: 54.5 kg 54.7 kg 54.1 kg    Examination:  General exam: Appears calm and comfortable  Respiratory system: Clear to auscultation. Respiratory effort normal. Cardiovascular system: S1 & S2 heard, RRR. No JVD, murmurs, rubs, gallops or  clicks. No pedal edema. Gastrointestinal system: Abdomen is nondistended, soft and nontender. No organomegaly or masses felt. Normal bowel sounds heard. Central nervous system: Alert and oriented. No focal neurological deficits. Extremities: Symmetric 5 x 5 power. Skin: No rashes, lesions or ulcers Psychiatry: Judgement  and insight appear normal. Mood & affect appropriate.     Data Reviewed: I have personally reviewed following labs and imaging studies  CBC: Recent Labs  Lab 05/18/18 1546 05/19/18 0432 05/20/18 0445 05/21/18 0441 05/22/18 0421 05/23/18 0524  WBC 5.2 4.3 3.8* 4.8 5.7 8.6  NEUTROABS 4.3  --   --  4.2 3.6 6.5  HGB 11.5* 10.7* 11.4* 12.1 11.2* 11.7*  HCT 35.6* 33.3* 35.6* 37.7 34.1* 36.5  MCV 101.4* 101.8* 101.1* 100.8* 100.6* 100.8*  PLT 252 263 291 339 314 756   Basic Metabolic Panel: Recent Labs  Lab 05/18/18 1546 05/19/18 0432 05/21/18 0441  NA 141 141 137  K 4.1 4.0 4.2  CL 106 110 103  CO2 26 23 24   GLUCOSE 119* 97 117*  BUN 15 12 9   CREATININE 0.49 0.48 0.47  CALCIUM 8.9 8.9 9.5   GFR: Estimated Creatinine Clearance: 52.7 mL/min (by C-G formula based on SCr of 0.47 mg/dL). Liver Function Tests: Recent Labs  Lab 05/18/18 1546  AST 30  ALT 19  ALKPHOS 62  BILITOT 0.5  PROT 6.5  ALBUMIN 3.2*   No results for input(s): LIPASE, AMYLASE in the last 168 hours. No results for input(s): AMMONIA in the last 168 hours. Coagulation Profile: Recent Labs  Lab 05/18/18 1546 05/19/18 1027 05/21/18 0441 05/22/18 0421 05/23/18 0524  INR 2.07 1.71 1.15 1.31 1.55   Cardiac Enzymes: No results for input(s): CKTOTAL, CKMB, CKMBINDEX, TROPONINI in the last 168 hours. BNP (last 3 results) No results for input(s): PROBNP in the last 8760 hours. HbA1C: No results for input(s): HGBA1C in the last 72 hours. CBG: No results for input(s): GLUCAP in the last 168 hours. Lipid Profile: No results for input(s): CHOL, HDL, LDLCALC, TRIG, CHOLHDL, LDLDIRECT in the last 72 hours. Thyroid Function Tests: No results for input(s): TSH, T4TOTAL, FREET4, T3FREE, THYROIDAB in the last 72 hours. Anemia Panel: No results for input(s): VITAMINB12, FOLATE, FERRITIN, TIBC, IRON, RETICCTPCT in the last 72 hours. Sepsis Labs: No results for input(s): PROCALCITON, LATICACIDVEN in the  last 168 hours.  Recent Results (from the past 240 hour(s))  Culture, blood (routine x 2)     Status: None   Collection Time: 05/18/18  4:17 PM  Result Value Ref Range Status   Specimen Description   Final    BLOOD LEFT ARM Performed at [facility] [address], [place], [place] [address]    Special Requests   Final    BOTTLES DRAWN AEROBIC AND ANAEROBIC Blood Culture adequate volume Performed at [facility] [address], [place], [place] [address]    Culture   Final    NO GROWTH 5 DAYS Performed at [facility], [place] [address] [place], [place] [address]    Report Status 05/23/2018 FINAL  Final  Culture, blood (routine x 2)     Status: None   Collection Time: 05/18/18  4:26 PM  Result Value Ref Range Status   Specimen Description   Final    BLOOD RIGHT ARM Performed at [facility] [address] [place], [place] [address]    Special Requests   Final    BOTTLES DRAWN AEROBIC AND ANAEROBIC Blood  Culture adequate volume Performed at [facility] [address], [place], [place] [address]    Culture   Final    NO GROWTH 5 DAYS Performed at [facility], [place] [address] [place], [place] [address]    Report Status 05/23/2018 FINAL  Final  Surgical pcr screen     Status: None   Collection Time: 05/20/18  8:39 AM  Result Value Ref Range Status   MRSA, PCR NEGATIVE NEGATIVE Final   Staphylococcus aureus NEGATIVE NEGATIVE Final    Comment: (NOTE) The Xpert SA Assay (FDA approved for NASAL specimens in patients 58 years of age and older), is one component of a comprehensive surveillance program. It is not intended to diagnose infection nor to guide or monitor treatment. Performed at [facility], [place] [address], [place], [place] [address]   Aerobic/Anaerobic Culture (surgical/deep wound)     Status: None (Preliminary result)   Collection Time: 05/20/18  6:40 PM   Result Value Ref Range Status   Specimen Description SYNOVIAL HIP LEFT  Final   Special Requests NONE  Final   Gram Stain   Final    RARE WBC PRESENT, PREDOMINANTLY PMN NO ORGANISMS SEEN Gram Stain Report Called to,Read Back By and Verified With: J.RIMANDO AT 2013 ON 05/20/18 BY N.THOMPSON    Culture   Final    FEW ENTEROCOCCUS FAECALIS NO ANAEROBES ISOLATED; CULTURE IN PROGRESS FOR 5 DAYS    Report Status PENDING  Incomplete   Organism ID, Bacteria ENTEROCOCCUS FAECALIS  Final      Susceptibility   Enterococcus faecalis - MIC*    AMPICILLIN <=2 SENSITIVE Sensitive     VANCOMYCIN 1 SENSITIVE Sensitive     GENTAMICIN SYNERGY Value in next row Sensitive      SENSITIVEPerformed at [facility] [address], [place], [place] [address]    * FEW ENTEROCOCCUS FAECALIS         Radiology Studies: No results found.      Scheduled Meds: . amoxicillin  500 mg Oral TID  . docusate sodium  100 mg Oral BID  . metoprolol tartrate  12.5 mg Oral BID  . nutrition supplement (JUVEN)  1 packet Oral BID BM  . Warfarin - Pharmacist Dosing Inpatient   Does not apply q1800   Continuous Infusions: . heparin 1,150 Units/hr (05/23/18 0600)     LOS: 5 days     Georgette Shell, MD [facility]  If 7PM-7AM, please contact night-coverage [email or web address] Password [facility] 05/23/2018, 12:31 PM

## 2018-05-23 NOTE — Progress Notes (Signed)
Physical Therapy Treatment Patient Details Name: Marissa Brown MRN: [id number] DOB: 11/25/1940 Today's Date: 05/23/2018    History of Present Illness Patient is a 77 yr old female admitted 05/18/2018 with abscess of left hip; I 7 D L hip wound 05/20/18; PMH: s/p L THA 04/06/18 hip fracture; s/p L TKA 02/11/18, heart valve replacement, chronic LBP, Tspine compression fracture, HLD.    PT Comments    Patient received in bed, very pleasant and eager to participate in skilled PT session today. She is able to complete functional bed mobility with Mod(I), and functional transfers and gait with RW with S, no physical assistance given this session. Able to tolerate ambulation approximately 629f today and was fatigued at EOS. She was left sitting up at the edge of the bed with all needs met and questions/concerns addressed this morning.    Follow Up Recommendations  Home health PT;Supervision for mobility/OOB     Equipment Recommendations  None recommended by PT    Recommendations for Other Services       Precautions / Restrictions Precautions Precautions: Fall Restrictions Weight Bearing Restrictions: No    Mobility  Bed Mobility Overal bed mobility: Modified Independent                Transfers Overall transfer level: Modified independent Equipment used: Rolling walker (2 wheeled) Transfers: Sit to/from Stand Sit to Stand: Supervision         General transfer comment: S for safety, no physical assist given   Ambulation/Gait Ambulation/Gait assistance: Supervision Gait Distance (Feet): 650 Feet Assistive device: Rolling walker (2 wheeled) Gait Pattern/deviations: Step-through pattern;Decreased stance time - left;Decreased stride length;Decreased step length - right;Decreased weight shift to left;Antalgic Gait velocity: decreased   General Gait Details: S for safety, no physical assist given, patient fatigued at end of gait period    Stairs              Wheelchair Mobility    Modified Rankin (Stroke Patients Only)       Balance Overall balance assessment: Needs assistance Sitting-balance support: Bilateral upper extremity supported;Feet supported Sitting balance-Leahy Scale: Good     Standing balance support: Bilateral upper extremity supported;During functional activity Standing balance-Leahy Scale: Good Standing balance comment: fair without AD                            Cognition Arousal/Alertness: Awake/alert Behavior During Therapy: WFL for tasks assessed/performed Overall Cognitive Status: Within Functional Limits for tasks assessed                                        Exercises      General Comments        Pertinent Vitals/Pain Pain Assessment: No/denies pain Pain Score: 0-No pain Pain Intervention(s): Limited activity within patient's tolerance;Monitored during session    Home Living                      Prior Function            PT Goals (current goals can now be found in the care plan section) Acute Rehab PT Goals Patient Stated Goal: go home with HHPT PT Goal Formulation: With patient Time For Goal Achievement: 05/29/18 Potential to Achieve Goals: Good Progress towards PT goals: Progressing toward goals    Frequency    Min 3X/week  PT Plan Current plan remains appropriate    Co-evaluation              AM-PAC PT "6 Clicks" Daily Activity  Outcome Measure  Difficulty turning over in bed (including adjusting bedclothes, sheets and blankets)?: A Little Difficulty moving from lying on back to sitting on the side of the bed? : A Little Difficulty sitting down on and standing up from a chair with arms (e.g., wheelchair, bedside commode, etc,.)?: A Little Help needed moving to and from a bed to chair (including a wheelchair)?: A Little Help needed walking in hospital room?: A Little Help needed climbing 3-5 steps with a railing? : A  Little 6 Click Score: 18    End of Session   Activity Tolerance: Patient tolerated treatment well Patient left: in bed;with call bell/phone within reach(sitting at EOB )   PT Visit Diagnosis: Other abnormalities of gait and mobility (R26.89);Difficulty in walking, not elsewhere classified (R26.2);Unsteadiness on feet (R26.81);History of falling (Z91.81)     Time: 1144-1158 PT Time Calculation (min) (ACUTE ONLY): 14 min  Charges:  $Gait Training: 8-22 mins                     Kristen Unger PT, DPT, CBIS  Supplemental Physical Therapist [place] [facility]    Pager [phone] Acute Rehab Office [phone]    

## 2018-05-24 LAB — HEPARIN LEVEL (UNFRACTIONATED): HEPARIN UNFRACTIONATED: 0.48 [IU]/mL (ref 0.30–0.70)

## 2018-05-24 LAB — CBC
HEMATOCRIT: 35.3 % — AB (ref 36.0–46.0)
Hemoglobin: 11.3 g/dL — ABNORMAL LOW (ref 12.0–15.0)
MCH: 32.3 pg (ref 26.0–34.0)
MCHC: 32 g/dL (ref 30.0–36.0)
MCV: 100.9 fL — AB (ref 78.0–100.0)
PLATELETS: 330 10*3/uL (ref 150–400)
RBC: 3.5 MIL/uL — ABNORMAL LOW (ref 3.87–5.11)
RDW: 16.1 % — AB (ref 11.5–15.5)
WBC: 13.1 10*3/uL — ABNORMAL HIGH (ref 4.0–10.5)

## 2018-05-24 LAB — PROTIME-INR
INR: 2.24
Prothrombin Time: 24.6 seconds — ABNORMAL HIGH (ref 11.4–15.2)

## 2018-05-24 MED ORDER — METOPROLOL TARTRATE 25 MG PO TABS: 12.5000 mg | ORAL_TABLET | Freq: Two times a day (BID) | ORAL | 0 refills | Status: AC

## 2018-05-24 MED ORDER — JUVEN PO PACK: 1.0000 | PACK | Freq: Two times a day (BID) | ORAL | 0 refills | Status: AC

## 2018-05-24 MED ORDER — WARFARIN SODIUM 5 MG PO TABS: 5.0000 mg | ORAL_TABLET | Freq: Every day | ORAL | Status: DC

## 2018-05-24 MED ORDER — AMOXICILLIN 500 MG PO CAPS: 500.0000 mg | ORAL_CAPSULE | Freq: Three times a day (TID) | ORAL | 0 refills | Status: AC

## 2018-05-24 NOTE — Discharge Summary (Signed)
Physician Discharge Summary  Marissa Brown WUJ:[id number] DOB: Oct 28, 1940 DOA: 05/18/2018  PCP: Jonathon Resides, MD  Admit date: 05/18/2018 Discharge date: 05/24/2018  Admitted From: HOME Disposition:HOME  Recommendations for Outpatient Follow-up:  1. Follow up with PCP in 1-2 weeks 2. Please obtain BMP/CBC in one week 3. HOME HEALTH TO CHECK INR 05/26/18  Home Health:YES Equipment/Devices:NONE  Discharge Condition:STABLE CODE STATUS:FULL Diet recommendation CARDIAC Brief/Interim Summary:77 year old female with hx of hypertension, hyperlipidemia, aortic valve disorder with mechanical AVR on Coumadin, left knee replacement in 5/29, left hip surgery on 04/06/2018 after a mechanical fall at [facility]. 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 Patient was told by her home health nurse on Friday, 05/15/2018 that her wound looked worse. Patient was seen by Dr. Rodney Cruise was placed on cephalexin.Patient has been taking cephalexin until presentation to ED, however noted fevers as high as 102 F at home. Pt also noted some bleeding from the left hip wound. Patient went to [facility] ED, where she was evaluated and found to have pus draining from the wound.CT of the left hip showed 20 cm abscess per the ED physician report. Pt admitted for further management. Discharge Diagnoses:  Principal Problem:   Abscess of left hip Active Problems:   OA (osteoarthritis) of knee   H/O heart valve replacement with mechanical valve   History of aortic valve disorder   Chronic back pain   Hyperlipidemia   Abscess of hip, left  Abscess of left hip with history of left hip surgery/replacement in 05/5620, complicated with large hematoma, eschar and chronic wound Afebrile, with no leukocytosis BC X 2 NGTD CT [facility]  predominantly subcutaneous 19.7 cm abscess overlying the left gluteal muscle with extension to the skin surface and into the left gluteus maximus muscle. No clear communication with the left hip joint. Orthopedics on board: S/P I&D on 05/20/18 Deep wound culture Enterococcus faecalis sensitive to ampicillin will DC cefepime and start ampicillin. Patient will be discharged on ampicillin for 6 more days.  HTN Stable Continue lopressor  History of aortic valve disorder s/p mechanical valve replacement-INR today is 2.2 patient anxious to go home.  She will continue her home dose of Coumadin and check INR on Tuesday, 05/26/2018.  Patient reports that she has home health who checks her INR.  OA (osteoarthritis) of knee Continue analgesics as needed  Chronic back pain Continue with analgesics as needed  Hyperlipidemia Continue statins  Discharge Instructions  Discharge Instructions    Call MD / Call 911   Complete by:  As directed    If you experience chest pain or shortness of breath, CALL 911 and be transported to the hospital emergency room.  If you develope a fever above 101 F, pus (white drainage) or increased drainage or redness at the wound, or calf pain, call your surgeon's office.   Change dressing   Complete by:  As directed    Change the dressing daily with sterile 4 x 4 inch gauze dressing and paper tape.   Constipation Prevention   Complete by:  As directed    Drink plenty of fluids.  Prune juice may be helpful.  You may use a stool softener, such as Colace (over the counter) 100 mg twice a day.  Use MiraLax (over the counter) for constipation as needed.  Diet - low sodium heart healthy   Complete by:  As directed    Driving restrictions   Complete by:  As directed    No driving for two weeks   Increase activity slowly   Complete by:  As directed    TED hose   Complete by:  As directed    Use stockings (TED hose) for three weeks on both leg(s).  You may remove them  at night for sleeping.   Weight bearing as tolerated   Complete by:  As directed      Allergies as of 05/24/2018   No Known Allergies     Medication List    STOP taking these medications   celecoxib 200 MG capsule Commonly known as:  CELEBREX   cephALEXin 250 MG capsule Commonly known as:  KEFLEX   midodrine 2.5 MG tablet Commonly known as:  PROAMATINE   ondansetron 4 MG tablet Commonly known as:  ZOFRAN     TAKE these medications   acetaminophen 500 MG tablet Commonly known as:  TYLENOL Take 1,000 mg by mouth every 6 (six) hours as needed for moderate pain or headache.   amoxicillin 500 MG capsule Commonly known as:  AMOXIL Take 1 capsule (500 mg total) by mouth 3 (three) times daily.   CALCIUM CITRATE-VITAMIN D PO Take 1 tablet by mouth daily.   diphenhydramine-acetaminophen 25-500 MG Tabs tablet Commonly known as:  TYLENOL PM Take 1 tablet by mouth at bedtime.   HYDROcodone-acetaminophen 5-325 MG tablet Commonly known as:  NORCO/VICODIN Take 1 tablet by mouth every 6 (six) hours as needed for moderate pain.   metoprolol tartrate 25 MG tablet Commonly known as:  LOPRESSOR Take 0.5 tablets (12.5 mg total) by mouth 2 (two) times daily.   nutrition supplement (JUVEN) Pack Take 1 packet by mouth 2 (two) times daily between meals.   pravastatin 40 MG tablet Commonly known as:  PRAVACHOL Take 40 mg by mouth at bedtime.   SANTYL ointment Generic drug:  collagenase Apply 1 application topically every evening.   traMADol 50 MG tablet Commonly known as:  ULTRAM Take 1-2 tablets (50-100 mg total) by mouth every 6 (six) hours as needed for moderate pain.   warfarin 5 MG tablet Commonly known as:  COUMADIN Take 5 mg by mouth every evening.            Discharge Care Instructions  (From admission, onward)         Start     Ordered   05/21/18 0000  Weight bearing as tolerated     05/21/18 0858   05/21/18 0000  Change dressing    Comments:  Change the  dressing daily with sterile 4 x 4 inch gauze dressing and paper tape.   05/21/18 0858         Follow-up Information    Gaynelle Arabian, MD. Schedule an appointment as soon as possible for a visit on 06/04/2018.   Specialty:  Orthopedic Surgery Contact information: [address] [place] [address] [phone]        Jonathon Resides, MD Follow up.   Specialty:  Family Medicine Contact information: [place] [address] [phone]          No Known Allergies  Consultations:  NONE   Procedures/Studies:  No results found. (Echo, Carotid, EGD, Colonoscopy, ERCP)    Subjective: Feels well anxious to go home   Discharge Exam: Vitals:   05/23/18 2107 05/24/18  0552  BP: (!) 127/58 (!) 157/57  Pulse: 77 64  Resp: (!) 24 18  Temp: 99.1 F (37.3 C) 97.8 F (36.6 C)  SpO2: 96% 97%   Vitals:   05/23/18 0543 05/23/18 1421 05/23/18 2107 05/24/18 0552  BP: (!) 159/55 (!) 144/89 (!) 127/58 (!) 157/57  Pulse: 61 71 77 64  Resp: 20 18 (!) 24 18  Temp: 98 F (36.7 C) 98.7 F (37.1 C) 99.1 F (37.3 C) 97.8 F (36.6 C)  TempSrc: Oral Oral  Oral  SpO2: 96% 99% 96% 97%  Weight:      Height:        General: Pt is alert, awake, not in acute distress Cardiovascular: RRR, S1/S2 +, no rubs, no gallops Respiratory: CTA bilaterally, no wheezing, no rhonchi Abdominal: Soft, NT, ND, bowel sounds + Extremities: no edema, no cyanosis    The results of significant diagnostics from this hospitalization (including imaging, microbiology, ancillary and laboratory) are listed below for reference.     Microbiology: Recent Results (from the past 240 hour(s))  Culture, blood (routine x 2)     Status: None   Collection Time: 05/18/18  4:17 PM  Result Value Ref Range Status   Specimen Description   Final    BLOOD LEFT ARM Performed at [facility], [place] [address], [place], [place] [address]    Special Requests    Final    BOTTLES DRAWN AEROBIC AND ANAEROBIC Blood Culture adequate volume Performed at [facility] [address] [place], [place] [address]    Culture   Final    NO GROWTH 5 DAYS Performed at [facility], [place] [address], [place], [place] [address]    Report Status 05/23/2018 FINAL  Final  Culture, blood (routine x 2)     Status: None   Collection Time: 05/18/18  4:26 PM  Result Value Ref Range Status   Specimen Description   Final    BLOOD RIGHT ARM Performed at [facility] [address], [place], [place] [address]    Special Requests   Final    BOTTLES DRAWN AEROBIC AND ANAEROBIC Blood Culture adequate volume Performed at [facility] [address] [place], [place] [address]    Culture   Final    NO GROWTH 5 DAYS Performed at [facility], [place] [address] [place], [place] [address]    Report Status 05/23/2018 FINAL  Final  Surgical pcr screen     Status: None   Collection Time: 05/20/18  8:39 AM  Result Value Ref Range Status   MRSA, PCR NEGATIVE NEGATIVE Final   Staphylococcus aureus NEGATIVE NEGATIVE Final    Comment: (NOTE) The Xpert SA Assay (FDA approved for NASAL specimens in patients 36 years of age and older), is one component of a comprehensive surveillance program. It is not intended to diagnose infection nor to guide or monitor treatment. Performed at [facility], [place] [address] [place], [place] [address]   Aerobic/Anaerobic Culture (surgical/deep wound)     Status: None (Preliminary result)   Collection Time: 05/20/18  6:40 PM  Result Value Ref Range Status   Specimen Description SYNOVIAL HIP LEFT  Final   Special Requests NONE  Final   Gram Stain   Final    RARE WBC PRESENT, PREDOMINANTLY PMN NO ORGANISMS SEEN Gram Stain Report Called to,Read Back By and Verified With: J.RIMANDO AT 2013 ON 05/20/18 BY N.THOMPSON  Culture   Final    FEW ENTEROCOCCUS  FAECALIS NO ANAEROBES ISOLATED; CULTURE IN PROGRESS FOR 5 DAYS    Report Status PENDING  Incomplete   Organism ID, Bacteria ENTEROCOCCUS FAECALIS  Final      Susceptibility   Enterococcus faecalis - MIC*    AMPICILLIN <=2 SENSITIVE Sensitive     VANCOMYCIN 1 SENSITIVE Sensitive     GENTAMICIN SYNERGY Value in next row Sensitive      SENSITIVEPerformed at [facility], [address], [place], [place] [address]    * FEW ENTEROCOCCUS FAECALIS     Labs: BNP (last 3 results) No results for input(s): BNP in the last 8760 hours. Basic Metabolic Panel: Recent Labs  Lab 05/18/18 1546 05/19/18 0432 05/21/18 0441  NA 141 141 137  K 4.1 4.0 4.2  CL 106 110 103  CO2 26 23 24   GLUCOSE 119* 97 117*  BUN 15 12 9   CREATININE 0.49 0.48 0.47  CALCIUM 8.9 8.9 9.5   Liver Function Tests: Recent Labs  Lab 05/18/18 1546  AST 30  ALT 19  ALKPHOS 62  BILITOT 0.5  PROT 6.5  ALBUMIN 3.2*   No results for input(s): LIPASE, AMYLASE in the last 168 hours. No results for input(s): AMMONIA in the last 168 hours. CBC: Recent Labs  Lab 05/18/18 1546  05/20/18 0445 05/21/18 0441 05/22/18 0421 05/23/18 0524 05/24/18 0440  WBC 5.2   < > 3.8* 4.8 5.7 8.6 13.1*  NEUTROABS 4.3  --   --  4.2 3.6 6.5  --   HGB 11.5*   < > 11.4* 12.1 11.2* 11.7* 11.3*  HCT 35.6*   < > 35.6* 37.7 34.1* 36.5 35.3*  MCV 101.4*   < > 101.1* 100.8* 100.6* 100.8* 100.9*  PLT 252   < > 291 339 314 345 330   < > = values in this interval not displayed.   Cardiac Enzymes: No results for input(s): CKTOTAL, CKMB, CKMBINDEX, TROPONINI in the last 168 hours. BNP: Invalid input(s): POCBNP CBG: No results for input(s): GLUCAP in the last 168 hours. D-Dimer No results for input(s): DDIMER in the last 72 hours. Hgb A1c No results for input(s): HGBA1C in the last 72 hours. Lipid Profile No results for input(s): CHOL, HDL, LDLCALC, TRIG, CHOLHDL, LDLDIRECT in the last 72 hours. Thyroid function studies No results  for input(s): TSH, T4TOTAL, T3FREE, THYROIDAB in the last 72 hours.  Invalid input(s): FREET3 Anemia work up No results for input(s): VITAMINB12, FOLATE, FERRITIN, TIBC, IRON, RETICCTPCT in the last 72 hours. Urinalysis    Component Value Date/Time   COLORURINE STRAW (A) 01/22/2018 0649   APPEARANCEUR CLEAR 01/22/2018 0649   LABSPEC 1.008 01/22/2018 0649   PHURINE 7.0 01/22/2018 0649   GLUCOSEU NEGATIVE 01/22/2018 0649   HGBUR SMALL (A) 01/22/2018 0649   BILIRUBINUR NEGATIVE 01/22/2018 0649   [place] 01/22/2018 0649   PROTEINUR NEGATIVE 01/22/2018 0649   NITRITE NEGATIVE 01/22/2018 0649   LEUKOCYTESUR TRACE (A) 01/22/2018 0649   Sepsis Labs Invalid input(s): PROCALCITONIN,  WBC,  LACTICIDVEN Microbiology Recent Results (from the past 240 hour(s))  Culture, blood (routine x 2)     Status: None   Collection Time: 05/18/18  4:17 PM  Result Value Ref Range Status   Specimen Description   Final    BLOOD LEFT ARM Performed at [facility] [address], [place], [place] [address]    Special Requests   Final    BOTTLES DRAWN AEROBIC  AND ANAEROBIC Blood Culture adequate volume Performed at [facility] [address], [place], [place] [address]    Culture   Final    NO GROWTH 5 DAYS Performed at [facility], [place] [address], [place], [place] [address]    Report Status 05/23/2018 FINAL  Final  Culture, blood (routine x 2)     Status: None   Collection Time: 05/18/18  4:26 PM  Result Value Ref Range Status   Specimen Description   Final    BLOOD RIGHT ARM Performed at [facility] [address] [place], [place] [address]    Special Requests   Final    BOTTLES DRAWN AEROBIC AND ANAEROBIC Blood Culture adequate volume Performed at [facility] [address] [place], [place] [address]    Culture   Final    NO GROWTH 5 DAYS Performed at [facility], [place] [address],  [place], [place] [address]    Report Status 05/23/2018 FINAL  Final  Surgical pcr screen     Status: None   Collection Time: 05/20/18  8:39 AM  Result Value Ref Range Status   MRSA, PCR NEGATIVE NEGATIVE Final   Staphylococcus aureus NEGATIVE NEGATIVE Final    Comment: (NOTE) The Xpert SA Assay (FDA approved for NASAL specimens in patients 55 years of age and older), is one component of a comprehensive surveillance program. It is not intended to diagnose infection nor to guide or monitor treatment. Performed at [facility], [place] [address] [place], [place] [address]   Aerobic/Anaerobic Culture (surgical/deep wound)     Status: None (Preliminary result)   Collection Time: 05/20/18  6:40 PM  Result Value Ref Range Status   Specimen Description SYNOVIAL HIP LEFT  Final   Special Requests NONE  Final   Gram Stain   Final    RARE WBC PRESENT, PREDOMINANTLY PMN NO ORGANISMS SEEN Gram Stain Report Called to,Read Back By and Verified With: J.RIMANDO AT 2013 ON 05/20/18 BY N.THOMPSON    Culture   Final    FEW ENTEROCOCCUS FAECALIS NO ANAEROBES ISOLATED; CULTURE IN PROGRESS FOR 5 DAYS    Report Status PENDING  Incomplete   Organism ID, Bacteria ENTEROCOCCUS FAECALIS  Final      Susceptibility   Enterococcus faecalis - MIC*    AMPICILLIN <=2 SENSITIVE Sensitive     VANCOMYCIN 1 SENSITIVE Sensitive     GENTAMICIN SYNERGY Value in next row Sensitive      SENSITIVEPerformed at [facility] [address], [place],  [address]    * FEW ENTEROCOCCUS FAECALIS     Time coordinating discharge: 35 minutes  SIGNED:   Georgette Shell, MD  [facility] 05/24/2018, 10:19 AM Pager   If 7PM-7AM, please contact night-coverage [email or web address] Password [email or web address]

## 2018-05-24 NOTE — Progress Notes (Signed)
Pt is discharged to home. Dc instructions given with husband at bedside. Prescription x 1 for pain med also given. . No concerns voiced. Left unit in wheelchair pushed by nurse tech. Left in stable condition. Hale Bogus.

## 2018-05-24 NOTE — Care Management Note (Signed)
Case Management Note  Patient Details  Name: Marissa Brown MRN: [id number] Date of Birth: 29-Apr-1941  Subjective/Objective:    Abscess of left hip, hx of left hip surgery on 7/19                Action/Plan: NCM spoke to pt and she is active with [facility] for [facility], Contacted [facility] to make aware of dc home today. Pt has RW, cane and bedside commode at home. Husband at home to assist with care.   Expected Discharge Date:  05/24/18               Expected Discharge Plan:  [place]  In-House Referral:  NA  Discharge planning Services  CM Consult  Post Acute Care Choice:  Home Health Choice offered to:  Patient  DME Arranged:  N/A DME Agency:  NA  HH Arranged:  RN [place] Agency:  [address]  Status of Service:  Completed, signed off  If discussed at [place] of Stay Meetings, dates discussed:    Additional Comments:  Erenest Rasher, RN 05/24/2018, 11:59 AM

## 2018-05-24 NOTE — Progress Notes (Signed)
[place] for heparin and warfarin Indication: hx St. Jude AVR, CVA   No Known Allergies  Patient Measurements:  Height: 5\' 5"  (165.1 cm) Weight: 119 lb 4.3 oz (54.1 kg) IBW/kg (Calculated) : 57 Heparin Dosing Weight: 54 kg  Vital Signs: Temp: 97.8 F (36.6 C) (09/08 0552) Temp Source: Oral (09/08 0552) BP: 157/57 (09/08 0552) Pulse Rate: 64 (09/08 0552)  Labs: Recent Labs    05/22/18 0421 05/23/18 0524 05/24/18 0440  HGB 11.2* 11.7* 11.3*  HCT 34.1* 36.5 35.3*  PLT 314 345 330  LABPROT 16.1* 18.4* 24.6*  INR 1.31 1.55 2.24  HEPARINUNFRC 0.42 0.45 0.48    Estimated Creatinine Clearance: 52.7 mL/min (by C-G formula based on SCr of 0.47 mg/dL).  Assessment: Patient's a 77 y.o F with hx St. Jude AVR and CVA on warfarin PTA and left hip surgery/replacement in July 2019, presented to [facility] [place] on 9/2 for management of hip abscess. Warfarin place on hold on admission due to left hip hematoma and abscess. Patient underwent I and D hip hematoma on 9/4. Warfarin resumed post-op on 9/4 and heparin resumed back on 9/5 on 0530.   Warfarin home dose: 5 mg PO every evening  Today, 05/24/2018: - Heparin level remains therapeutic at 0.48 on heparin drip rate of 1150 unit/hr - INR 2.24 after 10 mg dose of coumadin given yesterday, almost to 2.5-3.5 goal for valve - CBC stable - Drug-drug interactions: antibiotic use can make patient more sensitive to warfarin (amox)  - on regular diet  Goal of Therapy:  Heparin level 0.3-0.7 units/ml;  INR 2.5-3.5 (per outpt [facility] clinic note) Monitor platelets by anticoagulation protocol: Yes   Plan:  - anticipate heparin will be dc'd today by MD - will resume home coumadin dose of 5 mg every evening.  Eudelia Bunch, Pharm.D [phone] 05/24/2018 9:45 AM

## 2018-05-26 LAB — AEROBIC/ANAEROBIC CULTURE (SURGICAL/DEEP WOUND)

## 2018-05-26 LAB — AEROBIC/ANAEROBIC CULTURE W GRAM STAIN (SURGICAL/DEEP WOUND)

## 2018-05-26 NOTE — Addendum Note (Signed)
Addendum  created 05/26/18 1050 by Tawni Millers, CRNA   Charge Capture section accepted

## 2018-06-04 ENCOUNTER — Telehealth: Payer: Self-pay

## 2018-06-04 NOTE — Telephone Encounter (Signed)
Entry error

## 2019-03-16 ENCOUNTER — Other Ambulatory Visit: Payer: Self-pay | Admitting: Rehabilitation

## 2019-03-16 DIAGNOSIS — M546 Pain in thoracic spine: Secondary | ICD-10-CM

## 2019-04-03 ENCOUNTER — Other Ambulatory Visit: Payer: Medicare Other

## 2019-08-05 ENCOUNTER — Other Ambulatory Visit: Payer: Self-pay | Admitting: Orthopedic Surgery

## 2019-08-05 DIAGNOSIS — M546 Pain in thoracic spine: Secondary | ICD-10-CM

## 2019-08-29 ENCOUNTER — Ambulatory Visit
Admission: RE | Admit: 2019-08-29 | Discharge: 2019-08-29 | Disposition: A | Discharge status: Home / Self Care | Payer: Medicare Other | Source: Ambulatory Visit | Attending: Orthopedic Surgery | Admitting: Orthopedic Surgery

## 2019-08-29 ENCOUNTER — Other Ambulatory Visit: Payer: Self-pay

## 2019-08-29 DIAGNOSIS — M546 Pain in thoracic spine: Secondary | ICD-10-CM
